# Patient Record
Sex: Male | Born: 1955 | Race: Black or African American | Hispanic: No | Marital: Single | Smoking: Current every day smoker
Health system: Southern US, Community
[De-identification: ages and names within clinical notes are randomized; demographics above are authoritative.]

## PROBLEM LIST (undated history)

## (undated) DIAGNOSIS — I319 Disease of pericardium, unspecified: Secondary | ICD-10-CM

## (undated) DIAGNOSIS — I10 Essential (primary) hypertension: Secondary | ICD-10-CM

---

## 2005-02-07 ENCOUNTER — Emergency Department (HOSPITAL_COMMUNITY): Admission: EM | Admit: 2005-02-07 | Discharge: 2005-02-07 | Payer: Self-pay | Admitting: Emergency Medicine

## 2007-02-13 ENCOUNTER — Emergency Department (HOSPITAL_COMMUNITY): Admission: EM | Admit: 2007-02-13 | Discharge: 2007-02-13 | Payer: Self-pay | Admitting: Family Medicine

## 2012-03-25 ENCOUNTER — Emergency Department (HOSPITAL_COMMUNITY)
Admission: EM | Admit: 2012-03-25 | Discharge: 2012-03-25 | Disposition: A | Discharge status: Home / Self Care | Payer: Self-pay | Attending: Emergency Medicine | Admitting: Emergency Medicine

## 2012-03-25 ENCOUNTER — Encounter (HOSPITAL_COMMUNITY): Payer: Self-pay | Admitting: Emergency Medicine

## 2012-03-25 DIAGNOSIS — F17200 Nicotine dependence, unspecified, uncomplicated: Secondary | ICD-10-CM | POA: Insufficient documentation

## 2012-03-25 DIAGNOSIS — K0889 Other specified disorders of teeth and supporting structures: Secondary | ICD-10-CM

## 2012-03-25 DIAGNOSIS — K089 Disorder of teeth and supporting structures, unspecified: Secondary | ICD-10-CM | POA: Insufficient documentation

## 2012-03-25 DIAGNOSIS — Z888 Allergy status to other drugs, medicaments and biological substances status: Secondary | ICD-10-CM | POA: Insufficient documentation

## 2012-03-25 MED ORDER — PENICILLIN V POTASSIUM 500 MG PO TABS: 500.0000 mg | ORAL_TABLET | Freq: Four times a day (QID) | ORAL | Status: AC

## 2012-03-25 MED ORDER — OXYCODONE-ACETAMINOPHEN 5-325 MG PO TABS: 1.0000 | ORAL_TABLET | ORAL | Status: DC | PRN

## 2012-03-25 NOTE — ED Provider Notes (Signed)
History     CSN: [id number]  Arrival date & time 03/25/12  7829   First MD Initiated Contact with Patient 03/25/12 [phone]      Chief Complaint  Patient presents with  . Dental Pain    Patient is a 56 y.o. male presenting with tooth pain. The history is provided by the patient.  Dental PainThe primary symptoms include mouth pain. Primary symptoms do not include fever. The symptoms began yesterday. The symptoms are worsening. The symptoms are new. The symptoms occur constantly.  Additional symptoms do not include: facial swelling.     PMH - none  History reviewed. No pertinent past surgical history.  History reviewed. No pertinent family history.  History  Substance Use Topics  . Smoking status: Current Every Day Smoker  . Smokeless tobacco: Not on file  . Alcohol Use: Yes      Review of Systems  Constitutional: Negative for fever.  HENT: Negative for facial swelling.     Allergies  Aspirin  Home Medications   Current Outpatient Rx  Name Route Sig Dispense Refill  . OXYCODONE-ACETAMINOPHEN 5-325 MG PO TABS Oral Take 1 tablet by mouth every 4 (four) hours as needed for pain. 3 tablet 0  . PENICILLIN V POTASSIUM 500 MG PO TABS Oral Take 1 tablet (500 mg total) by mouth 4 (four) times daily. 40 tablet 0    BP 160/93  Pulse 58  Temp 97.8 F (36.6 C) (Oral)  Resp 18  SpO2 100%  Physical Exam CONSTITUTIONAL: Well developed/well nourished HEAD AND FACE: Normocephalic/atraumatic EYES: EOMI/PERRL ENMT: Mucous membranes moist.  Poor dentition.  No trismus.  No focal abscess noted. NECK: supple no meningeal signs CV: S1/S2 noted, no murmurs/rubs/gallops noted LUNGS: Lungs are clear to auscultation bilaterally, no apparent distress ABDOMEN: soft, nontender, no rebound or guarding NEURO: Pt is awake/alert, moves all extremitiesx4 EXTREMITIES:full ROM SKIN: warm, color normal   ED Course  Procedures     1. Pain, dental       MDM  Nursing notes including  past medical history and social history reviewed and considered in documentation         Joya Gaskins, MD 03/25/12 [phone]

## 2012-03-25 NOTE — ED Notes (Signed)
Pt c/o right sided dental pain in jaw x 1 week

## 2012-12-12 ENCOUNTER — Encounter (HOSPITAL_COMMUNITY): Payer: Self-pay | Admitting: Emergency Medicine

## 2012-12-12 ENCOUNTER — Emergency Department (HOSPITAL_COMMUNITY): Payer: Self-pay

## 2012-12-12 ENCOUNTER — Emergency Department (HOSPITAL_COMMUNITY)
Admission: EM | Admit: 2012-12-12 | Discharge: 2012-12-12 | Disposition: A | Discharge status: Home / Self Care | Payer: Self-pay | Attending: Emergency Medicine | Admitting: Emergency Medicine

## 2012-12-12 DIAGNOSIS — X503XXA Overexertion from repetitive movements, initial encounter: Secondary | ICD-10-CM | POA: Insufficient documentation

## 2012-12-12 DIAGNOSIS — S29011A Strain of muscle and tendon of front wall of thorax, initial encounter: Secondary | ICD-10-CM

## 2012-12-12 DIAGNOSIS — F17200 Nicotine dependence, unspecified, uncomplicated: Secondary | ICD-10-CM | POA: Insufficient documentation

## 2012-12-12 DIAGNOSIS — Y999 Unspecified external cause status: Secondary | ICD-10-CM | POA: Insufficient documentation

## 2012-12-12 DIAGNOSIS — W292XXA Contact with other powered household machinery, initial encounter: Secondary | ICD-10-CM | POA: Insufficient documentation

## 2012-12-12 DIAGNOSIS — IMO0002 Reserved for concepts with insufficient information to code with codable children: Secondary | ICD-10-CM | POA: Insufficient documentation

## 2012-12-12 DIAGNOSIS — S0993XA Unspecified injury of face, initial encounter: Secondary | ICD-10-CM | POA: Insufficient documentation

## 2012-12-12 DIAGNOSIS — Y929 Unspecified place or not applicable: Secondary | ICD-10-CM | POA: Insufficient documentation

## 2012-12-12 LAB — POCT I-STAT TROPONIN I: Troponin i, poc: 0 ng/mL (ref 0.00–0.08)

## 2012-12-12 LAB — POCT I-STAT, CHEM 8
Calcium, Ion: 1.16 mmol/L (ref 1.12–1.23)
Chloride: 106 mEq/L (ref 96–112)
Glucose, Bld: 88 mg/dL (ref 70–99)
HCT: 51 % (ref 39.0–52.0)
Sodium: 139 mEq/L (ref 135–145)

## 2012-12-12 MED ORDER — HYDROCODONE-ACETAMINOPHEN 5-325 MG PO TABS
1.0000 | ORAL_TABLET | Freq: Once | ORAL | Status: AC
  Administered 2012-12-12: 1 via ORAL
  Filled 2012-12-12: qty 1

## 2012-12-12 MED ORDER — METHOCARBAMOL 500 MG PO TABS
500.0000 mg | ORAL_TABLET | Freq: Once | ORAL | Status: AC
  Administered 2012-12-12: 500 mg via ORAL
  Filled 2012-12-12: qty 1

## 2012-12-12 MED ORDER — METHOCARBAMOL 500 MG PO TABS: 500.0000 mg | ORAL_TABLET | Freq: Two times a day (BID) | ORAL | Status: DC

## 2012-12-12 MED ORDER — HYDROCODONE-ACETAMINOPHEN 5-325 MG PO TABS: 1.0000 | ORAL_TABLET | ORAL | Status: DC | PRN

## 2012-12-12 NOTE — ED Notes (Signed)
Pt states he cannot afford his medications or the hospital bill.  Case Mgmt notified and looking into assistance for Robaxin Rx.

## 2012-12-12 NOTE — ED Provider Notes (Signed)
History     CSN: [id number]  Arrival date & time 12/12/12  4098   First MD Initiated Contact with Patient 12/12/12 [phone]      Chief Complaint  Patient presents with  . Chest Pain    (Consider location/radiation/quality/duration/timing/severity/associated sxs/prior treatment) HPI Pt was lifting large household appliances yesterday and began having L shoulder, back, chest and arm pain yesterday evening that worsened through the night. Pain is worse with movement of arm and palpation of muscle of the area. No SOB. No cough. No PMH. No N/V. No lower ext swelling or pain. Pt states he took ibuprofen yesterday and began having acidic taste in mouth yesterday evening and this AM. No epigastric tenderness.  History reviewed. No pertinent past medical history.  History reviewed. No pertinent past surgical history.  History reviewed. No pertinent family history.  History  Substance Use Topics  . Smoking status: Current Every Day Smoker  . Smokeless tobacco: Not on file  . Alcohol Use: Yes      Review of Systems  Constitutional: Negative for fever and chills.  HENT: Positive for neck pain.   Respiratory: Negative for cough and shortness of breath.   Cardiovascular: Positive for chest pain. Negative for palpitations and leg swelling.  Gastrointestinal: Negative for nausea, vomiting and abdominal pain.  Musculoskeletal: Positive for myalgias. Negative for arthralgias.  Skin: Negative for rash and wound.  Neurological: Negative for dizziness, weakness, light-headedness, numbness and headaches.  All other systems reviewed and are negative.    Allergies  Aspirin  Home Medications   Current Outpatient Rx  Name  Route  Sig  Dispense  Refill  . HYDROcodone-acetaminophen (NORCO) 5-325 MG per tablet   Oral   Take 1 tablet by mouth every 4 (four) hours as needed for pain.   10 tablet   0   . methocarbamol (ROBAXIN) 500 MG tablet   Oral   Take 1 tablet (500 mg total) by mouth 2  (two) times daily.   20 tablet   0   . oxyCODONE-acetaminophen (PERCOCET/ROXICET) 5-325 MG per tablet   Oral   Take 1 tablet by mouth every 4 (four) hours as needed for pain.   3 tablet   0     BP 130/82  Pulse 76  Temp(Src) 98.7 F (37.1 C) (Oral)  Resp 16  SpO2 92%  Physical Exam  Nursing note and vitals reviewed. Constitutional: He is oriented to person, place, and time. He appears well-developed and well-nourished. No distress.  HENT:  Head: Normocephalic and atraumatic.  Mouth/Throat: Oropharynx is clear and moist.  Eyes: EOM are normal. Pupils are equal, round, and reactive to light.  Neck: Normal range of motion. Neck supple.  Cardiovascular: Normal rate and regular rhythm.   Pulmonary/Chest: Effort normal and breath sounds normal. No respiratory distress. He has no wheezes. He has no rales. He exhibits tenderness (Chest tenderness reproduced completely by palpation of left upper chest. No crepitance or deformtiy. ).  Abdominal: Soft. Bowel sounds are normal.  Musculoskeletal: Normal range of motion. He exhibits tenderness (TTP of L trapezieus, l supraspintous, L rhomboid, L deltoid, L bicep and L tricep. No evidence of injury). He exhibits no edema.  Pain worse with ROM of L shoulder. 2+radial pulses.   Neurological: He is alert and oriented to person, place, and time.  5/5 motor in all ext, sensation intact  Skin: Skin is warm and dry. No rash noted. No erythema.  Psychiatric: He has a normal mood and affect. His behavior  is normal.    ED Course  Procedures (including critical care time)  Labs Reviewed  POCT I-STAT, CHEM 8 - Abnormal; Notable for the following:    Hemoglobin 17.3 (*)    All other components within normal limits  POCT I-STAT TROPONIN I   Dg Chest 2 View  12/12/2012   *RADIOLOGY REPORT*  Clinical Data: Chest pain.  CHEST - 2 VIEW  Comparison: 02/07/2005.  Findings: Trachea is midline.  Heart size normal. Lungs are slightly low in volume.  There  is mild interstitial prominence diffusely.  No focal airspace consolidation and no pleural fluid. A lucent lesion in the distal right clavicle is unchanged from 2006 and is therefore considered benign.  IMPRESSION: Mild diffuse interstitial prominence may be due to vascular crowding related to slightly low lung volumes.  A viral process could also have this appearance.  Edema is considered less likely, given normal heart size.   Original Report Authenticated By: Leanna Battles, M.D.     1. Muscle strain of chest wall, initial encounter      Date: 12/12/2012  Rate: 77  Rhythm: normal sinus rhythm  QRS Axis: normal  Intervals: normal  ST/T Wave abnormalities: normal  Conduction Disutrbances:none  Narrative Interpretation:   Old EKG Reviewed: none available Questionable PR depression in inferior and lateral leads.   MDM  Pt symptoms are clearly musculoskeletal in nature. No old EKG to compare PR abnormalities to. Will do screening trop since symptoms > 12 hour old to rule out acute MI.   Neg trop. Will d/c home with symptomatic control.       Loren Racer, MD 12/12/12 1015

## 2012-12-12 NOTE — ED Notes (Signed)
Pt c/o CP with radiation into left arm starting today; pt sts worse with inspiration

## 2012-12-12 NOTE — ED Notes (Signed)
Dr. Yelverton at the bedside.  

## 2012-12-12 NOTE — Care Management Note (Signed)
    Page 1 of 1   12/12/2012     11:59:12 AM   CARE MANAGEMENT NOTE 12/12/2012  Patient:  Phillip Gilbert,Phillip Gilbert   Account Number:  [id number]  Date Initiated:  12/12/2012  Documentation initiated by:  Carel Schnee  Subjective/Objective Assessment:     Action/Plan:   Anticipated DC Date:  12/12/2012   Anticipated DC Plan:        DC Planning Services  Medication Assistance      Choice offered to / List presented to:             Status of service:   Medicare Important Message given?   (If response is "NO", the following Medicare IM given date fields will be blank) Date Medicare IM given:   Date Additional Medicare IM given:    Discharge Disposition:    Per UR Regulation:    If discussed at Long Length of Stay Meetings, dates discussed:    Comments:  Pt being discharged with two medications, one is a narcotic (not eligible for [facility]).  I spoke with the patient regarding out of pocket expense for both medications versus being assisted with the [facility] program and his the ability to only access this once per year.  The patient decided to use our outpatient pharmacy and pay for his prescriptions out of pocket.

## 2013-04-02 ENCOUNTER — Emergency Department (HOSPITAL_COMMUNITY)
Admission: EM | Admit: 2013-04-02 | Discharge: 2013-04-02 | Disposition: A | Discharge status: Home / Self Care | Payer: Self-pay | Attending: Emergency Medicine | Admitting: Emergency Medicine

## 2013-04-02 ENCOUNTER — Encounter (HOSPITAL_COMMUNITY): Payer: Self-pay | Admitting: Emergency Medicine

## 2013-04-02 DIAGNOSIS — F1010 Alcohol abuse, uncomplicated: Secondary | ICD-10-CM | POA: Insufficient documentation

## 2013-04-02 DIAGNOSIS — F1210 Cannabis abuse, uncomplicated: Secondary | ICD-10-CM | POA: Insufficient documentation

## 2013-04-02 DIAGNOSIS — K089 Disorder of teeth and supporting structures, unspecified: Secondary | ICD-10-CM | POA: Insufficient documentation

## 2013-04-02 DIAGNOSIS — Z79899 Other long term (current) drug therapy: Secondary | ICD-10-CM | POA: Insufficient documentation

## 2013-04-02 DIAGNOSIS — F17200 Nicotine dependence, unspecified, uncomplicated: Secondary | ICD-10-CM | POA: Insufficient documentation

## 2013-04-02 DIAGNOSIS — J329 Chronic sinusitis, unspecified: Secondary | ICD-10-CM | POA: Insufficient documentation

## 2013-04-02 DIAGNOSIS — Z8679 Personal history of other diseases of the circulatory system: Secondary | ICD-10-CM | POA: Insufficient documentation

## 2013-04-02 DIAGNOSIS — K0889 Other specified disorders of teeth and supporting structures: Secondary | ICD-10-CM

## 2013-04-02 HISTORY — DX: Disease of pericardium, unspecified: I31.9

## 2013-04-02 MED ORDER — FLUTICASONE PROPIONATE 50 MCG/ACT NA SUSP: 2.0000 | Freq: Every day | NASAL | Status: DC

## 2013-04-02 MED ORDER — HYDROCODONE-ACETAMINOPHEN 5-325 MG PO TABS: 1.0000 | ORAL_TABLET | Freq: Four times a day (QID) | ORAL | Status: DC | PRN

## 2013-04-02 MED ORDER — AMOXICILLIN 500 MG PO CAPS: 500.0000 mg | ORAL_CAPSULE | Freq: Three times a day (TID) | ORAL | Status: DC

## 2013-04-02 NOTE — Discharge Instructions (Signed)
Continue ibuprofen for pain. Saline nasal rinses. Amoxicillin as prescribed until all gone. flonase for congestion. Norco as prescribed as needed for severe pain. Follow up with your dentist and primary care doctor.    Dental Pain A tooth ache may be caused by cavities (tooth decay). Cavities expose the nerve of the tooth to air and hot or cold temperatures. It may come from an infection or abscess (also called a boil or furuncle) around your tooth. It is also often caused by dental caries (tooth decay). This causes the pain you are having. DIAGNOSIS  Your caregiver can diagnose this problem by exam. TREATMENT   If caused by an infection, it may be treated with medications which kill germs (antibiotics) and pain medications as prescribed by your caregiver. Take medications as directed.  Only take over-the-counter or prescription medicines for pain, discomfort, or fever as directed by your caregiver.  Whether the tooth ache today is caused by infection or dental disease, you should see your dentist as soon as possible for further care. SEEK MEDICAL CARE IF: The exam and treatment you received today has been provided on an emergency basis only. This is not a substitute for complete medical or dental care. If your problem worsens or new problems (symptoms) appear, and you are unable to meet with your dentist, call or return to this location. SEEK IMMEDIATE MEDICAL CARE IF:   You have a fever.  You develop redness and swelling of your face, jaw, or neck.  You are unable to open your mouth.  You have severe pain uncontrolled by pain medicine. MAKE SURE YOU:   Understand these instructions.  Will watch your condition.  Will get help right away if you are not doing well or get worse. Document Released: 06/19/2005 Document Revised: 09/11/2011 Document Reviewed: 02/05/2008 [facility] Patient Information 2014 [place], [place]. Sinusitis Sinusitis is redness, soreness, and swelling  (inflammation) of the paranasal sinuses. Paranasal sinuses are air pockets within the bones of your face (beneath the eyes, the middle of the forehead, or above the eyes). In healthy paranasal sinuses, mucus is able to drain out, and air is able to circulate through them by way of your nose. However, when your paranasal sinuses are inflamed, mucus and air can become trapped. This can allow bacteria and other germs to grow and cause infection. Sinusitis can develop quickly and last only a short time (acute) or continue over a long period (chronic). Sinusitis that lasts for more than 12 weeks is considered chronic.  CAUSES  Causes of sinusitis include:  Allergies.  Structural abnormalities, such as displacement of the cartilage that separates your nostrils (deviated septum), which can decrease the air flow through your nose and sinuses and affect sinus drainage.  Functional abnormalities, such as when the small hairs (cilia) that line your sinuses and help remove mucus do not work properly or are not present. SYMPTOMS  Symptoms of acute and chronic sinusitis are the same. The primary symptoms are pain and pressure around the affected sinuses. Other symptoms include:  Upper toothache.  Earache.  Headache.  Bad breath.  Decreased sense of smell and taste.  A cough, which worsens when you are lying flat.  Fatigue.  Fever.  Thick drainage from your nose, which often is green and may contain pus (purulent).  Swelling and warmth over the affected sinuses. DIAGNOSIS  Your caregiver will perform a physical exam. During the exam, your caregiver may:  Look in your nose for signs of abnormal growths in your nostrils (  nasal polyps).  Tap over the affected sinus to check for signs of infection.  View the inside of your sinuses (endoscopy) with a special imaging device with a light attached (endoscope), which is inserted into your sinuses. If your caregiver suspects that you have chronic  sinusitis, one or more of the following tests may be recommended:  Allergy tests.  Nasal culture A sample of mucus is taken from your nose and sent to a lab and screened for bacteria.  Nasal cytology A sample of mucus is taken from your nose and examined by your caregiver to determine if your sinusitis is related to an allergy. TREATMENT  Most cases of acute sinusitis are related to a viral infection and will resolve on their own within 10 days. Sometimes medicines are prescribed to help relieve symptoms (pain medicine, decongestants, nasal steroid sprays, or saline sprays).  However, for sinusitis related to a bacterial infection, your caregiver will prescribe antibiotic medicines. These are medicines that will help kill the bacteria causing the infection.  Rarely, sinusitis is caused by a fungal infection. In theses cases, your caregiver will prescribe antifungal medicine. For some cases of chronic sinusitis, surgery is needed. Generally, these are cases in which sinusitis recurs more than 3 times per year, despite other treatments. HOME CARE INSTRUCTIONS   Drink plenty of water. Water helps thin the mucus so your sinuses can drain more easily.  Use a humidifier.  Inhale steam 3 to 4 times a day (for example, sit in the bathroom with the shower running).  Apply a warm, moist washcloth to your face 3 to 4 times a day, or as directed by your caregiver.  Use saline nasal sprays to help moisten and clean your sinuses.  Take over-the-counter or prescription medicines for pain, discomfort, or fever only as directed by your caregiver. SEEK IMMEDIATE MEDICAL CARE IF:  You have increasing pain or severe headaches.  You have nausea, vomiting, or drowsiness.  You have swelling around your face.  You have vision problems.  You have a stiff neck.  You have difficulty breathing. MAKE SURE YOU:   Understand these instructions.  Will watch your condition.  Will get help right away if  you are not doing well or get worse. Document Released: 06/19/2005 Document Revised: 09/11/2011 Document Reviewed: 07/04/2011 [facility] Patient Information 2014 [place], [place].

## 2013-04-02 NOTE — ED Notes (Signed)
Mouth pain lower x 3 days needs teeth pulled

## 2013-04-02 NOTE — ED Provider Notes (Signed)
CSN: [id number]     Arrival date & time 04/02/13  1015 History   This chart was scribed for non-physician practitioner Jaynie Crumble, PA-C working with Suzi Roots, MD by Valera Castle, ED scribe. This patient was seen in room TR05C/TR05C and the patient's care was started at 12:10 PM.    Chief Complaint  Patient presents with  . Dental Pain    Patient is a 57 y.o. male presenting with tooth pain. The history is provided by the patient. No language interpreter was used.  Dental Pain Location:  Lower Severity:  Moderate Onset quality:  Gradual Duration:  3 days Timing:  Constant Progression:  Worsening Chronicity:  New Context comment:  Pt states he needs his teeth pulled. Associated symptoms: no fever    HPI Comments: Phillip Gilbert is a 57 y.o. male with a h/o pericarditis who presents to the Emergency Department complaining of gradual, moderate, constant, lower dental pain, onset 3 days ago. He reports that he needs to have his teeth pulled. He reports associated congestion and reports that he has been unable to sleep due to the pain. He denies fever, and any other associated symptoms. He reports being any every day smoker, marijuana, and EtOH use. He has an allergy to aspirin. He denies any other medical history.   Dentist - [facility].  PCP - Jeanice Lim.  Past Medical History  Diagnosis Date  . Pericarditis    History reviewed. No pertinent past surgical history. No family history on file. History  Substance Use Topics  . Smoking status: Current Every Day Smoker  . Smokeless tobacco: Not on file  . Alcohol Use: Yes    Review of Systems  Constitutional: Negative for fever.  HENT: Positive for dental problem (Lower dental pain.).   All other systems reviewed and are negative.    Allergies  Aspirin  Home Medications   Current Outpatient Rx  Name  Route  Sig  Dispense  Refill  . acetaminophen (TYLENOL) 325 MG tablet   Oral   Take 650 mg  by mouth every 6 (six) hours as needed for pain.         Marland Kitchen amitriptyline (ELAVIL) 25 MG tablet   Oral   Take 25 mg by mouth at bedtime.         . traMADol (ULTRAM) 50 MG tablet   Oral   Take 300 mg by mouth every 6 (six) hours as needed for pain.          Triage Vitals: BP 136/83  Pulse 83  Temp(Src) 98.3 F (36.8 C)  SpO2 97%  Physical Exam  Nursing note and vitals reviewed. Constitutional: He is oriented to person, place, and time. He appears well-developed and well-nourished. No distress.  HENT:  Head: Normocephalic and atraumatic.  Right Ear: Tympanic membrane, external ear and ear canal normal.  Left Ear: Tympanic membrane, external ear and ear canal normal.  Mouth/Throat: Uvula is midline, oropharynx is clear and moist and mucous membranes are normal.  Clear nasal drainage. Edentulous upper gum. Poor dentition with several cavities to lower teeth. Mild gum swelling over central and lower incisors. Tenderness to palpation.  Eyes: EOM are normal.  Neck: Neck supple. No tracheal deviation present.  Cardiovascular: Normal rate.   Pulmonary/Chest: Effort normal. No respiratory distress.  Musculoskeletal: Normal range of motion.  Neurological: He is alert and oriented to person, place, and time.  Skin: Skin is warm and dry.  Psychiatric: He has a normal mood  and affect. His behavior is normal.    ED Course  Procedures (including critical care time)  DIAGNOSTIC STUDIES: Oxygen Saturation is 97% on room air, normal by my interpretation.    COORDINATION OF CARE: 12:14 PM-Discussed treatment plan which includes Amoxil, Flonase, and Norco with pt at bedside and pt agreed to plan. Recommeded pt f/u with his dentist.     Labs Review Labs Reviewed - No data to display Imaging Review No results found.  MDM   1. Sinusitis   2. Pain, dental    Patient with poor dentition, but possible dental abscess to the lower central incisors. Patient also having some sinus  congestion and sinus pain and pressure. I will start her on amoxicillin for the infection, Flonase for sinusitis, pain medication. Followup with a dentist and primary care Dr.  Ceasar Mons Vitals:   04/02/13 1041 04/02/13 1225  BP: 136/83 145/74  Pulse: 83 84  Temp: 98.3 F (36.8 C) 98.1 F (36.7 C)  TempSrc:  Oral  Resp:  18  SpO2: 97% 98%    I personally performed the services described in this documentation, which was scribed in my presence. The recorded information has been reviewed and is accurate.    Lottie Mussel, PA-C 04/02/13 1616

## 2013-04-07 NOTE — ED Provider Notes (Signed)
Medical screening examination/treatment/procedure(s) were performed by non-physician practitioner and as supervising physician I was immediately available for consultation/collaboration.   Regine Christian E Jae Bruck, MD 04/07/13 0736 

## 2019-02-20 ENCOUNTER — Emergency Department (HOSPITAL_COMMUNITY): Payer: No Typology Code available for payment source

## 2019-02-20 ENCOUNTER — Encounter (HOSPITAL_COMMUNITY): Payer: Self-pay | Admitting: Emergency Medicine

## 2019-02-20 ENCOUNTER — Emergency Department (HOSPITAL_COMMUNITY)
Admission: EM | Admit: 2019-02-20 | Discharge: 2019-02-20 | Disposition: A | Discharge status: Home / Self Care | Payer: No Typology Code available for payment source | Attending: Emergency Medicine | Admitting: Emergency Medicine

## 2019-02-20 ENCOUNTER — Other Ambulatory Visit: Payer: Self-pay

## 2019-02-20 DIAGNOSIS — M545 Low back pain: Secondary | ICD-10-CM | POA: Insufficient documentation

## 2019-02-20 DIAGNOSIS — M546 Pain in thoracic spine: Secondary | ICD-10-CM | POA: Insufficient documentation

## 2019-02-20 DIAGNOSIS — M549 Dorsalgia, unspecified: Secondary | ICD-10-CM

## 2019-02-20 DIAGNOSIS — F17210 Nicotine dependence, cigarettes, uncomplicated: Secondary | ICD-10-CM | POA: Insufficient documentation

## 2019-02-20 LAB — URINALYSIS, ROUTINE W REFLEX MICROSCOPIC
Bilirubin Urine: NEGATIVE
Glucose, UA: NEGATIVE mg/dL
Hgb urine dipstick: NEGATIVE
Ketones, ur: NEGATIVE mg/dL
Leukocytes,Ua: NEGATIVE
Nitrite: NEGATIVE
Protein, ur: NEGATIVE mg/dL
Specific Gravity, Urine: 1.014 (ref 1.005–1.030)
pH: 6 (ref 5.0–8.0)

## 2019-02-20 MED ORDER — CYCLOBENZAPRINE HCL 10 MG PO TABS
5.0000 mg | ORAL_TABLET | Freq: Once | ORAL | Status: AC
  Administered 2019-02-20: 15:00:00 5 mg via ORAL
  Filled 2019-02-20: qty 1

## 2019-02-20 MED ORDER — CYCLOBENZAPRINE HCL 5 MG PO TABS: 5.0000 mg | ORAL_TABLET | Freq: Two times a day (BID) | ORAL | 0 refills | Status: DC | PRN

## 2019-02-20 NOTE — ED Notes (Signed)
Patient transported to X-ray 

## 2019-02-20 NOTE — ED Notes (Signed)
Patient Alert and oriented to baseline. Stable and ambulatory to baseline. Patient verbalized understanding of the discharge instructions.  Patient belongings were taken by the patient.   

## 2019-02-20 NOTE — Discharge Instructions (Addendum)
Please read attached information. If you experience any new or worsening signs or symptoms please return to the emergency room for evaluation. Please follow-up with your primary care provider or specialist as discussed. Please use medication prescribed only as directed and discontinue taking if you have any concerning signs or symptoms.   °

## 2019-02-20 NOTE — ED Provider Notes (Signed)
[facility] EMERGENCY DEPARTMENT Provider Note   CSN: [id number] Arrival date & time: 02/20/19  1145     History   Chief Complaint Chief Complaint  Patient presents with  . Back Pain    HPI Phillip Gilbert is a 63 y.o. male.     HPI   63 year old male presents today with complaints of back pain.  Patient notes over the last month he has had throbbing bilateral thoracic lumbar back pain.  He notes is worse with movement, denies any abdominal pain fever nausea vomiting.  Denies any distal neurological deficits.  Denies any chest pain or shortness of breath, no cough.  He notes he has been using Doan's back pills without improvement in symptoms.  Also notes he is using gabapentin and lidocaine patches.  Denies any lower extremity edema.  He notes normal urination.    Past Medical History:  Diagnosis Date  . Pericarditis     There are no active problems to display for this patient.   History reviewed. No pertinent surgical history.      Home Medications    Prior to Admission medications   Medication Sig Start Date End Date Taking? Authorizing Provider  acetaminophen (TYLENOL) 325 MG tablet Take 650 mg by mouth every 6 (six) hours as needed for pain.    [provider]  amitriptyline (ELAVIL) 25 MG tablet Take 25 mg by mouth at bedtime.    [provider]  amoxicillin (AMOXIL) 500 MG capsule Take 1 capsule (500 mg total) by mouth 3 (three) times daily. 04/02/13   Kirichenko, Tatyana, PA-C  cyclobenzaprine (FLEXERIL) 5 MG tablet Take 1 tablet (5 mg total) by mouth 2 (two) times daily as needed for muscle spasms. 02/20/19   Elford Evilsizer, Dellis Filbert, PA-C  fluticasone (FLONASE) 50 MCG/ACT nasal spray Place 2 sprays into the nose daily. 04/02/13   Kirichenko, Lahoma Rocker, PA-C  HYDROcodone-acetaminophen (NORCO) 5-325 MG per tablet Take 1 tablet by mouth every 6 (six) hours as needed for pain. 04/02/13   Kirichenko, Tatyana, PA-C  traMADol (ULTRAM) 50 MG  tablet Take 300 mg by mouth every 6 (six) hours as needed for pain.    [provider]    Family History No family history on file.  Social History Social History   Tobacco Use  . Smoking status: Current Every Day Smoker  . Smokeless tobacco: Never Used  Substance Use Topics  . Alcohol use: Yes  . Drug use: Yes    Types: Marijuana, Cocaine     Allergies   Aspirin   Review of Systems Review of Systems  All other systems reviewed and are negative.   Physical Exam Updated Vital Signs BP (!) 144/89 (BP Location: Left Arm)   Pulse 73   Temp 98.4 F (36.9 C) (Oral)   Resp 16   Ht 5\' 10"  (1.778 m)   Wt 79.8 kg   SpO2 97%   BMI 25.25 kg/m   Physical Exam Vitals signs and nursing note reviewed.  Constitutional:      Appearance: He is well-developed.  HENT:     Head: Normocephalic and atraumatic.  Eyes:     General: No scleral icterus.       Right eye: No discharge.        Left eye: No discharge.     Conjunctiva/sclera: Conjunctivae normal.     Pupils: Pupils are equal, round, and reactive to light.  Neck:     Musculoskeletal: Normal range of motion.  Vascular: No JVD.     Trachea: No tracheal deviation.  Pulmonary:     Effort: Pulmonary effort is normal.     Breath sounds: No stridor.  Musculoskeletal:     Comments: Tenderness palpation of bilateral lower thoracic and upper lumbar musculature, no CT or L-spine tenderness palpation bilateral upper and lower extremity sensation strength and motor function intact  Neurological:     Mental Status: He is alert and oriented to person, place, and time.     Coordination: Coordination normal.  Psychiatric:        Behavior: Behavior normal.        Thought Content: Thought content normal.        Judgment: Judgment normal.    ED Treatments / Results  Labs (all labs ordered are listed, but only abnormal results are displayed) Labs Reviewed  URINALYSIS, ROUTINE W REFLEX MICROSCOPIC    EKG None   Radiology Dg Thoracic Spine 2 View  Result Date: 02/20/2019 CLINICAL DATA:  Back pain EXAM: THORACIC SPINE 2 VIEWS COMPARISON:  Chest x-ray 12/12/2012 FINDINGS: There is no evidence of thoracic spine fracture. Alignment is normal. No other significant bone abnormalities are identified. IMPRESSION: Negative. Electronically Signed   By: Jasmine PangKim  Fujinaga M.D.   On: 02/20/2019 14:05   Dg Lumbar Spine Complete  Result Date: 02/20/2019 CLINICAL DATA:  Back pain EXAM: LUMBAR SPINE - COMPLETE 4+ VIEW COMPARISON:  None. FINDINGS: Lumbar alignment is normal. Vertebral body heights are maintained. Mild disc space narrowing at L3-L4. Posterior facet degenerative changes. IMPRESSION: No acute osseous abnormality.  Mild degenerative change at L3-L4. Electronically Signed   By: Jasmine PangKim  Fujinaga M.D.   On: 02/20/2019 14:08    Procedures Procedures (including critical care time)  Medications Ordered in ED Medications  cyclobenzaprine (FLEXERIL) tablet 5 mg (5 mg Oral Given 02/20/19 1522)     Initial Impression / Assessment and Plan / ED Course  I have reviewed the triage vital signs and the nursing notes.  Pertinent labs & imaging results that were available during my care of the patient were reviewed by me and considered in my medical decision making (see chart for details).        Labs:   Imaging:  Consults:  Therapeutics:  Discharge Meds:   Assessment/Plan: 63 year old male presents today with likely muscular back pain.  He has no signs of intra-abdominal pathology, reproduced on my exam.  He is well-appearing in no acute distress.  Discharged with symptomatic care and strict return precautions.  He verbalized understanding and agreement to today's plan had no further questions or concerns at time of discharge.   Final Clinical Impressions(s) / ED Diagnoses   Final diagnoses:  Musculoskeletal back pain    ED Discharge Orders         Ordered    cyclobenzaprine (FLEXERIL) 5 MG tablet  2  times daily PRN     02/20/19 1531           Eyvonne MechanicHedges, Edlyn Rosenburg, PA-C 02/20/19 1539    Blane OharaZavitz, Joshua, MD 02/20/19 [phone]

## 2019-02-20 NOTE — ED Triage Notes (Signed)
Pt states he has been having lower back pain for over a month. He is concerned it's his kidneys. Pt states he does feel pressure when he needs to urinate but doesn't always urinate a lot. Denies painful urination.

## 2019-02-20 NOTE — ED Notes (Signed)
.  bfd

## 2019-11-25 ENCOUNTER — Encounter (HOSPITAL_COMMUNITY): Payer: Self-pay | Admitting: *Deleted

## 2019-11-25 ENCOUNTER — Emergency Department (HOSPITAL_COMMUNITY)
Admission: EM | Admit: 2019-11-25 | Discharge: 2019-11-25 | Disposition: A | Discharge status: Home / Self Care | Payer: No Typology Code available for payment source | Attending: Emergency Medicine | Admitting: Emergency Medicine

## 2019-11-25 ENCOUNTER — Emergency Department (HOSPITAL_COMMUNITY): Payer: No Typology Code available for payment source

## 2019-11-25 DIAGNOSIS — J40 Bronchitis, not specified as acute or chronic: Secondary | ICD-10-CM

## 2019-11-25 DIAGNOSIS — Z79899 Other long term (current) drug therapy: Secondary | ICD-10-CM | POA: Diagnosis not present

## 2019-11-25 DIAGNOSIS — K219 Gastro-esophageal reflux disease without esophagitis: Secondary | ICD-10-CM | POA: Diagnosis not present

## 2019-11-25 DIAGNOSIS — Z720 Tobacco use: Secondary | ICD-10-CM

## 2019-11-25 DIAGNOSIS — F17210 Nicotine dependence, cigarettes, uncomplicated: Secondary | ICD-10-CM | POA: Insufficient documentation

## 2019-11-25 DIAGNOSIS — R0789 Other chest pain: Secondary | ICD-10-CM | POA: Diagnosis present

## 2019-11-25 LAB — CBC
HCT: 50 % (ref 39.0–52.0)
Hemoglobin: 16.7 g/dL (ref 13.0–17.0)
MCH: 29.4 pg (ref 26.0–34.0)
MCHC: 33.4 g/dL (ref 30.0–36.0)
MCV: 88 fL (ref 80.0–100.0)
Platelets: 238 10*3/uL (ref 150–400)
RBC: 5.68 MIL/uL (ref 4.22–5.81)
RDW: 14.7 % (ref 11.5–15.5)
WBC: 8.7 10*3/uL (ref 4.0–10.5)
nRBC: 0 % (ref 0.0–0.2)

## 2019-11-25 LAB — BASIC METABOLIC PANEL
Anion gap: 11 (ref 5–15)
BUN: 13 mg/dL (ref 8–23)
CO2: 23 mmol/L (ref 22–32)
Calcium: 9.1 mg/dL (ref 8.9–10.3)
Chloride: 106 mmol/L (ref 98–111)
Creatinine, Ser: 1.27 mg/dL — ABNORMAL HIGH (ref 0.61–1.24)
GFR calc Af Amer: >60 mL/min (ref >60)
GFR calc non Af Amer: 60 mL/min — ABNORMAL LOW (ref >60)
Glucose, Bld: 103 mg/dL — ABNORMAL HIGH (ref 70–99)
Potassium: 3.6 mmol/L (ref 3.5–5.1)
Sodium: 140 mmol/L (ref 135–145)

## 2019-11-25 LAB — TROPONIN I (HIGH SENSITIVITY)
Troponin I (High Sensitivity): 5 ng/L (ref <18)
Troponin I (High Sensitivity): 5 ng/L (ref <18)

## 2019-11-25 MED ORDER — DOXYCYCLINE HYCLATE 100 MG PO TABS
100.0000 mg | ORAL_TABLET | Freq: Once | ORAL | Status: AC
  Administered 2019-11-25: 100 mg via ORAL
  Filled 2019-11-25: qty 1

## 2019-11-25 MED ORDER — SODIUM CHLORIDE 0.9% FLUSH: 3.0000 mL | Freq: Once | INTRAVENOUS | Status: DC

## 2019-11-25 MED ORDER — SODIUM CHLORIDE 0.9 % IV SOLN
80.0000 mg | Freq: Once | INTRAVENOUS | Status: AC
  Administered 2019-11-25: 80 mg via INTRAVENOUS
  Filled 2019-11-25: qty 80

## 2019-11-25 MED ORDER — DOXYCYCLINE HYCLATE 100 MG PO CAPS
100.0000 mg | ORAL_CAPSULE | Freq: Two times a day (BID) | ORAL | 0 refills | Status: DC
Start: 2019-11-25 — End: 2019-11-25

## 2019-11-25 MED ORDER — PANTOPRAZOLE SODIUM 40 MG PO TBEC: 40.0000 mg | DELAYED_RELEASE_TABLET | Freq: Every day | ORAL | 0 refills | Status: AC

## 2019-11-25 MED ORDER — DOXYCYCLINE HYCLATE 100 MG PO CAPS
100.0000 mg | ORAL_CAPSULE | Freq: Two times a day (BID) | ORAL | 0 refills | Status: DC
Start: 2019-11-25 — End: 2022-12-31

## 2019-11-25 NOTE — ED Triage Notes (Signed)
To ED for eval of cp- started approx 3 hrs ago. States started like indigestion and radiated up chest and down right arm. Pt states he took tums without relief. Nausea. No vomiting. 10/10 pain currently. Described as 'someone sitting on chest'. States he's had this pain in past and prescribed Nitro - has not taken on tonight.

## 2019-11-25 NOTE — Discharge Instructions (Addendum)
Testing indicates that you have bronchitis, and likely some inflammation from gastroesophageal reflux causing your discomfort.  We are prescribing 2 medicines to improve your condition.  Make sure you follow-up with your primary care doctor, as needed for problems.  Try to stop smoking.

## 2019-11-25 NOTE — ED Provider Notes (Addendum)
[facility] EMERGENCY DEPARTMENT Provider Note   CSN: [id number] Arrival date & time: 11/25/19  0335     History Chief Complaint  Patient presents with  . Chest Pain    Phillip Gilbert is a 64 y.o. male.  HPI He presents for evaluation of his chest discomfort which reminds him of prior episode of pericarditis, which she had 25 years ago.  He describes intermittent chest pain, present since yesterday, on and off, only when he ambulates, extending from the epigastrium, to the center of the chest, then radiating to the right chest, and left arm.  The symptoms come and go.  They are not accompanied by diaphoresis, cough, shortness of breath, nausea, vomiting or diuresis.  He tried taking some Tums and Rolaids without relief.  He is a cigarette smoker.  No other recent illnesses.    Past Medical History:  Diagnosis Date  . Pericarditis     There are no problems to display for this patient.   History reviewed. No pertinent surgical history.     No family history on file.  Social History   Tobacco Use  . Smoking status: Current Every Day Smoker  . Smokeless tobacco: Never Used  Substance Use Topics  . Alcohol use: Yes  . Drug use: Yes    Types: Marijuana, Cocaine    Home Medications Prior to Admission medications   Medication Sig Start Date End Date Taking? Authorizing Provider  acetaminophen (TYLENOL) 325 MG tablet Take 650 mg by mouth every 6 (six) hours as needed for pain.   Yes [provider]  gabapentin (NEURONTIN) 300 MG capsule Take 300 mg by mouth 3 (three) times daily.   Yes [provider]  omeprazole (PRILOSEC) 40 MG capsule Take 40 mg by mouth daily.   Yes [provider]  cyclobenzaprine (FLEXERIL) 5 MG tablet Take 1 tablet (5 mg total) by mouth 2 (two) times daily as needed for muscle spasms. Patient not taking: Reported on 11/25/2019 02/20/19   Eyvonne Mechanic, PA-C  doxycycline (VIBRAMYCIN) 100 MG capsule Take 1  capsule (100 mg total) by mouth 2 (two) times daily. One po bid x 7 days 11/25/19   Mancel Bale, MD  fluticasone [facility]) 50 MCG/ACT nasal spray Place 2 sprays into the nose daily. Patient not taking: Reported on 11/25/2019 04/02/13   Jaynie Crumble, PA-C  HYDROcodone-acetaminophen (NORCO) 5-325 MG per tablet Take 1 tablet by mouth every 6 (six) hours as needed for pain. Patient not taking: Reported on 11/25/2019 04/02/13   Jaynie Crumble, PA-C  pantoprazole (PROTONIX) 40 MG tablet Take 1 tablet (40 mg total) by mouth daily. 11/25/19   Mancel Bale, MD    Allergies    Aspirin  Review of Systems   Review of Systems  All other systems reviewed and are negative.   Physical Exam Updated Vital Signs BP 125/73   Pulse 68   Temp 97.9 F (36.6 C)   Resp 20   Ht 5\' 10"  (1.778 m)   Wt 77.1 kg   SpO2 96%   BMI 24.39 kg/m   Physical Exam Vitals and nursing note reviewed.  Constitutional:      Appearance: He is well-developed.  HENT:     Head: Normocephalic and atraumatic.     Right Ear: External ear normal.     Left Ear: External ear normal.  Eyes:     Conjunctiva/sclera: Conjunctivae normal.     Pupils: Pupils are equal, round, and reactive to light.  Neck:  Trachea: Phonation normal.  Cardiovascular:     Rate and Rhythm: Normal rate and regular rhythm.     Heart sounds: Normal heart sounds. No murmur. No friction rub.  Pulmonary:     Effort: Pulmonary effort is normal.     Breath sounds: Normal breath sounds.  Abdominal:     General: There is no distension.     Palpations: Abdomen is soft.     Tenderness: There is no abdominal tenderness.  Musculoskeletal:        General: Normal range of motion.     Cervical back: Normal range of motion and neck supple.  Skin:    General: Skin is warm and dry.  Neurological:     Mental Status: He is alert and oriented to person, place, and time.     Cranial Nerves: No cranial nerve deficit.     Sensory: No sensory  deficit.     Motor: No abnormal muscle tone.     Coordination: Coordination normal.  Psychiatric:        Mood and Affect: Mood normal.        Behavior: Behavior normal.        Thought Content: Thought content normal.        Judgment: Judgment normal.     ED Results / Procedures / Treatments   Labs (all labs ordered are listed, but only abnormal results are displayed) Labs Reviewed  BASIC METABOLIC PANEL - Abnormal; Notable for the following components:      Result Value   Glucose, Bld 103 (*)    Creatinine, Ser 1.27 (*)    GFR calc non Af Amer 60 (*)    All other components within normal limits  CBC  TROPONIN I (HIGH SENSITIVITY)  TROPONIN I (HIGH SENSITIVITY)    EKG EKG Interpretation  Date/Time:  Tuesday Nov 25 2019 03:38:36 EDT Ventricular Rate:  77 PR Interval:  138 QRS Duration: 84 QT Interval:  396 QTC Calculation: 448 R Axis:   79 Text Interpretation: Normal sinus rhythm Normal ECG since last tracing no significant change Confirmed by Mancel Bale [phone]) on 11/25/2019 9:28:27 AM   Radiology DG Chest 2 View  Result Date: 11/25/2019 CLINICAL DATA:  Central chest pain. EXAM: CHEST - 2 VIEW COMPARISON:  12/12/2012 FINDINGS: The cardiomediastinal contours are normal. Bronchial/interstitial thickening is chronic and unchanged from prior exam. Pulmonary vasculature is normal. No consolidation, pleural effusion, or pneumothorax. No acute osseous abnormalities are seen. IMPRESSION: Chronic bronchial thickening likely smoking related. No acute findings. Electronically Signed   By: Narda Rutherford M.D.   On: 11/25/2019 04:00    Procedures Procedures (including critical care time)  Medications Ordered in ED Medications  sodium chloride flush (NS) 0.9 % injection 3 mL (3 mLs Intravenous Not Given 11/25/19 0838)  pantoprazole (PROTONIX) 80 mg in sodium chloride 0.9 % 100 mL IVPB (0 mg Intravenous Stopped 11/25/19 1102)  doxycycline (VIBRA-TABS) tablet 100 mg (100 mg Oral  Given 11/25/19 0954)    ED Course  I have reviewed the triage vital signs and the nursing notes.  Pertinent labs & imaging results that were available during my care of the patient were reviewed by me and considered in my medical decision making (see chart for details).  Clinical Course as of Nov 25 1430  Tue Nov 25, 2019  0926 Normal  Troponin I (High Sensitivity) [EW]  [phone] Normal   [EW]  [phone] Normal except creatinine elevated, GFR slightly low  Basic metabolic panel(!) [EW]  [phone]  No infiltrate or CHF, bronchitis appears to be present, interpreted by me  DG Chest 2 View [EW]    Clinical Course User Index [EW] Mancel Bale, MD   MDM Rules/Calculators/A&P                       Patient Vitals for the past 24 hrs:  BP Temp Temp src Pulse Resp SpO2 Height Weight  11/25/19 1415 125/73 -- -- 68 20 96 % -- --  11/25/19 1400 121/61 -- -- (!) 56 18 99 % -- --  11/25/19 1345 (!) 142/80 -- -- (!) 59 18 96 % -- --  11/25/19 1330 (!) 145/93 -- -- (!) 57 18 95 % -- --  11/25/19 1315 (!) 143/90 -- -- 61 17 94 % -- --  11/25/19 1300 (!) 142/93 -- -- (!) 54 17 91 % -- --  11/25/19 1230 125/76 -- -- 61 17 92 % -- --  11/25/19 1130 (!) 145/80 -- -- 65 (!) 21 92 % -- --  11/25/19 1100 (!) 141/83 -- -- 75 19 96 % -- --  11/25/19 1015 (!) 154/87 -- -- (!) 52 18 95 % -- --  11/25/19 0945 (!) 154/97 -- -- (!) 54 16 96 % -- --  11/25/19 0848 -- -- -- (!) 54 20 100 % -- --  11/25/19 0846 (!) 151/94 -- -- -- -- -- -- --  11/25/19 0637 138/84 97.9 F (36.6 C) -- 69 17 98 % -- --  11/25/19 0342 139/87 97.7 F (36.5 C) Oral 81 16 94 % 5\' 10"  (1.778 m) 77.1 kg    10:42 AM Reevaluation with update and discussion. After initial assessment and treatment, an updated evaluation reveals after eating he is comfortable.  Findings discussed and questions answered.   Medical Decision Making:  This patient is presenting for evaluation of epigastric and chest discomfort, which does require a  range of treatment options, and is a complaint that involves a moderate risk of morbidity and mortality. The differential diagnoses include pulmonary infection, ACS, pericarditis, muscular chest discomfort. I decided to review old records, and in summary middle-aged male, presenting with chest discomfort reminding him of pericarditis symptoms.  He is a smoker.  I did not require additional historical information from anyone.  Clinical Laboratory Tests Ordered, included CBC, Metabolic panel and Troponin. Review indicates normal delta troponin, normal CBC and metabolic panel, except creatinine slightly elevated. Radiologic Tests Ordered, included chest x-ray.  I independently Visualized: Radiographic images, which show normal cardiac silhouette, no pneumonia or heart failure  Cardiac Monitor Tracing which shows normal sinus rhythm    Critical Interventions-clinical evaluation, laboratory testing, repeat troponin, radiologic imaging, observation, medication treatment and reassessment  After These Interventions, the Patient was reevaluated and was found comfortable, tolerating diet, without additional complaints.  Patient with nonspecific symptoms, possibly acid related, with reflux.  Doubt ACS, pericarditis, metabolic instability or impending vascular collapse.  CRITICAL CARE-no Performed by: Mancel Bale  Nursing Notes Reviewed/ Care Coordinated Applicable Imaging Reviewed Interpretation of Laboratory Data incorporated into ED treatment  The patient appears reasonably screened and/or stabilized for discharge and I doubt any other medical condition or other [facility] requiring further screening, evaluation, or treatment in the ED at this time prior to discharge.  Plan: Home Medications-continue current; Home Treatments-rest, fluids, gradual advance diet; return here if the recommended treatment, does not improve the symptoms; Recommended follow up-PCP checkup 1 week and.     Final Clinical  Impression(s) / ED Diagnoses Final diagnoses:  Bronchitis  Gastroesophageal reflux disease, unspecified whether esophagitis present  Tobacco abuse    Rx / DC Orders ED Discharge Orders         Ordered    doxycycline (VIBRAMYCIN) 100 MG capsule  2 times daily,   Status:  Discontinued     11/25/19 0949    doxycycline (VIBRAMYCIN) 100 MG capsule  2 times daily     11/25/19 1430    pantoprazole (PROTONIX) 40 MG tablet  Daily     11/25/19 1430           Daleen Bo, MD 11/25/19 1046    Daleen Bo, MD 11/25/19 1432

## 2019-11-25 NOTE — ED Notes (Signed)
Pt states the pain he is having feels like the same pain when he had pericarditis.

## 2019-11-25 NOTE — ED Notes (Signed)
Gave pt sprite and sandwich for po challenge.

## 2020-06-09 ENCOUNTER — Emergency Department (HOSPITAL_COMMUNITY)
Admission: EM | Admit: 2020-06-09 | Discharge: 2020-06-09 | Disposition: A | Discharge status: Home / Self Care | Payer: No Typology Code available for payment source | Attending: Emergency Medicine | Admitting: Emergency Medicine

## 2020-06-09 ENCOUNTER — Other Ambulatory Visit: Payer: Self-pay

## 2020-06-09 ENCOUNTER — Encounter (HOSPITAL_COMMUNITY): Payer: Self-pay | Admitting: Emergency Medicine

## 2020-06-09 DIAGNOSIS — R300 Dysuria: Secondary | ICD-10-CM | POA: Insufficient documentation

## 2020-06-09 DIAGNOSIS — Z5321 Procedure and treatment not carried out due to patient leaving prior to being seen by health care provider: Secondary | ICD-10-CM | POA: Insufficient documentation

## 2020-06-09 HISTORY — DX: Essential (primary) hypertension: I10

## 2020-06-09 NOTE — ED Triage Notes (Signed)
Patient states when he tries to urinate he feels a significant amount of lower abdominal pressure. Saw PCP for same complaint and was prescribe Flomax a month ago, reports some improvement in symptoms but pressure continues. Was told by PCP he needs to see a urologist but has not yet. Patient alert, oriented, ambulatory, and in no apparent distress at this time.

## 2020-06-09 NOTE — ED Notes (Signed)
Pt called 3 x no reponse

## 2020-06-12 ENCOUNTER — Encounter (HOSPITAL_COMMUNITY): Payer: Self-pay | Admitting: *Deleted

## 2020-06-12 ENCOUNTER — Emergency Department (HOSPITAL_COMMUNITY): Payer: No Typology Code available for payment source

## 2020-06-12 ENCOUNTER — Other Ambulatory Visit: Payer: Self-pay

## 2020-06-12 ENCOUNTER — Emergency Department (HOSPITAL_COMMUNITY)
Admission: EM | Admit: 2020-06-12 | Discharge: 2020-06-13 | Disposition: A | Discharge status: Home / Self Care | Payer: No Typology Code available for payment source | Attending: Emergency Medicine | Admitting: Emergency Medicine

## 2020-06-12 DIAGNOSIS — N3091 Cystitis, unspecified with hematuria: Secondary | ICD-10-CM | POA: Insufficient documentation

## 2020-06-12 DIAGNOSIS — F17200 Nicotine dependence, unspecified, uncomplicated: Secondary | ICD-10-CM | POA: Insufficient documentation

## 2020-06-12 DIAGNOSIS — R319 Hematuria, unspecified: Secondary | ICD-10-CM | POA: Diagnosis present

## 2020-06-12 DIAGNOSIS — I10 Essential (primary) hypertension: Secondary | ICD-10-CM | POA: Diagnosis not present

## 2020-06-12 LAB — CBC WITH DIFFERENTIAL/PLATELET
Abs Immature Granulocytes: 0.04 10*3/uL (ref 0.00–0.07)
Basophils Absolute: 0.1 10*3/uL (ref 0.0–0.1)
Basophils Relative: 1 %
Eosinophils Absolute: 0.1 10*3/uL (ref 0.0–0.5)
Eosinophils Relative: 1 %
HCT: 46.5 % (ref 39.0–52.0)
Hemoglobin: 15.3 g/dL (ref 13.0–17.0)
Immature Granulocytes: 0 %
Lymphocytes Relative: 24 %
Lymphs Abs: 2.3 10*3/uL (ref 0.7–4.0)
MCH: 28.8 pg (ref 26.0–34.0)
MCHC: 32.9 g/dL (ref 30.0–36.0)
MCV: 87.6 fL (ref 80.0–100.0)
Monocytes Absolute: 0.9 10*3/uL (ref 0.1–1.0)
Monocytes Relative: 10 %
Neutro Abs: 6.1 10*3/uL (ref 1.7–7.7)
Neutrophils Relative %: 64 %
Platelets: 288 10*3/uL (ref 150–400)
RBC: 5.31 MIL/uL (ref 4.22–5.81)
RDW: 14.2 % (ref 11.5–15.5)
WBC: 9.5 10*3/uL (ref 4.0–10.5)
nRBC: 0 % (ref 0.0–0.2)

## 2020-06-12 LAB — URINALYSIS, ROUTINE W REFLEX MICROSCOPIC
Bilirubin Urine: NEGATIVE
Glucose, UA: NEGATIVE mg/dL
Ketones, ur: NEGATIVE mg/dL
Nitrite: POSITIVE — AB
Protein, ur: 100 mg/dL — AB
RBC / HPF: >50 RBC/hpf — ABNORMAL HIGH (ref 0–5)
Specific Gravity, Urine: 1.012 (ref 1.005–1.030)
WBC, UA: >50 WBC/hpf — ABNORMAL HIGH (ref 0–5)
pH: 5 (ref 5.0–8.0)

## 2020-06-12 MED ORDER — SODIUM CHLORIDE 0.9 % IV SOLN
1.0000 g | Freq: Once | INTRAVENOUS | Status: AC
  Administered 2020-06-12: 1 g via INTRAVENOUS
  Filled 2020-06-12: qty 10

## 2020-06-12 NOTE — ED Triage Notes (Signed)
Pt states he has been having painful urination with blood in urine for a while.

## 2020-06-12 NOTE — ED Provider Notes (Signed)
[facility]-EMERGENCY DEPT Provider Note   CSN: [id number] Arrival date & time: 06/12/20  1831     History Chief Complaint  Patient presents with  . Hematuria    Phillip Gilbert is a 64 y.o. male.  Patient presents to the emergency department with a chief complaint of hematuria with associated dysuria.  He states that he has had the hematuria for about a week, but it just became painful over the past 3 days or so.  He reports subjective fevers and chills at home, but denies any measured temperature.  Denies any treatments prior to arrival.  He was seen at the [place], and told that he needed to follow-up with the urologist, but has an appointment in the distant future.  He denies any discharge.  He does report some left-sided flank pain.  Denies history of kidney stones.  The history is provided by the patient. No language interpreter was used.       Past Medical History:  Diagnosis Date  . Hypertension   . Pericarditis     There are no problems to display for this patient.   History reviewed. No pertinent surgical history.     No family history on file.  Social History   Tobacco Use  . Smoking status: Current Every Day Smoker  . Smokeless tobacco: Never Used  Substance Use Topics  . Alcohol use: Yes  . Drug use: Yes    Types: Marijuana, Cocaine    Home Medications Prior to Admission medications   Medication Sig Start Date End Date Taking? Authorizing Provider  acetaminophen (TYLENOL) 325 MG tablet Take 650 mg by mouth every 6 (six) hours as needed for pain.    [provider]  cyclobenzaprine (FLEXERIL) 5 MG tablet Take 1 tablet (5 mg total) by mouth 2 (two) times daily as needed for muscle spasms. Patient not taking: Reported on 11/25/2019 02/20/19   Eyvonne Mechanic, PA-C  doxycycline (VIBRAMYCIN) 100 MG capsule Take 1 capsule (100 mg total) by mouth 2 (two) times daily. One po bid x 7 days 11/25/19   Mancel Bale, MD  fluticasone  [facility]) 50 MCG/ACT nasal spray Place 2 sprays into the nose daily. Patient not taking: Reported on 11/25/2019 04/02/13   Jaynie Crumble, PA-C  gabapentin (NEURONTIN) 300 MG capsule Take 300 mg by mouth 3 (three) times daily.    [provider]  HYDROcodone-acetaminophen (NORCO) 5-325 MG per tablet Take 1 tablet by mouth every 6 (six) hours as needed for pain. Patient not taking: Reported on 11/25/2019 04/02/13   Jaynie Crumble, PA-C  omeprazole (PRILOSEC) 40 MG capsule Take 40 mg by mouth daily.    [provider]  pantoprazole (PROTONIX) 40 MG tablet Take 1 tablet (40 mg total) by mouth daily. 11/25/19   Mancel Bale, MD    Allergies    Aspirin  Review of Systems   Review of Systems  All other systems reviewed and are negative.   Physical Exam Updated Vital Signs BP (!) 143/98   Pulse 68   Temp (!) 97.5 F (36.4 C) (Oral)   Resp 16   Ht 5' 10.5" (1.791 m)   Wt 78.5 kg   SpO2 96%   BMI 24.47 kg/m   Physical Exam Vitals and nursing note reviewed.  Constitutional:      Appearance: He is well-developed and well-nourished.  HENT:     Head: Normocephalic and atraumatic.  Eyes:     Conjunctiva/sclera: Conjunctivae normal.  Cardiovascular:  Rate and Rhythm: Normal rate and regular rhythm.     Heart sounds: No murmur heard.   Pulmonary:     Effort: Pulmonary effort is normal. No respiratory distress.     Breath sounds: Normal breath sounds.  Abdominal:     Palpations: Abdomen is soft.     Tenderness: There is no abdominal tenderness.  Musculoskeletal:        General: No edema. Normal range of motion.     Cervical back: Neck supple.  Skin:    General: Skin is warm and dry.  Neurological:     Mental Status: He is alert and oriented to person, place, and time.  Psychiatric:        Mood and Affect: Mood and affect and mood normal.        Behavior: Behavior normal.     ED Results / Procedures / Treatments   Labs (all labs ordered are  listed, but only abnormal results are displayed) Labs Reviewed  URINALYSIS, ROUTINE W REFLEX MICROSCOPIC - Abnormal; Notable for the following components:      Result Value   APPearance CLOUDY (*)    Hgb urine dipstick LARGE (*)    Protein, ur 100 (*)    Nitrite POSITIVE (*)    Leukocytes,Ua LARGE (*)    RBC / HPF >50 (*)    WBC, UA >50 (*)    Bacteria, UA MANY (*)    All other components within normal limits  URINE CULTURE  CBC WITH DIFFERENTIAL/PLATELET  BASIC METABOLIC PANEL    EKG None  Radiology No results found.  Procedures Procedures (including critical care time)  Medications Ordered in ED Medications  cefTRIAXone (ROCEPHIN) 1 g in sodium chloride 0.9 % 100 mL IVPB (has no administration in time range)    ED Course  I have reviewed the triage vital signs and the nursing notes.  Pertinent labs & imaging results that were available during my care of the patient were reviewed by me and considered in my medical decision making (see chart for details).    MDM Rules/Calculators/A&P                          This patient complains of hematuria and dysuria, this involves an extensive number of treatment options, and is a complaint that carries with it a high risk of complications and morbidity.    Differential Dx Cystitis, KS, bladder cancer, pyelonephritis  Pertinent Labs I ordered, reviewed, and interpreted labs, which included CBC, BMP, UA, which shows no evidence of kidney injury.  UA consistent with infection.  Imaging Interpretation I ordered imaging studies which included CT renal, which showed no obstructive uropathy.  Renal cysts are noted and discussed with patient.   Medications I ordered medication rocephin for UTI.  Reassessments After the interventions stated above, I reevaluated the patient and found stable fir discharge.  Consultants none  Plan Discharge.    Final Clinical Impression(s) / ED Diagnoses Final diagnoses:  Hemorrhagic  cystitis    Rx / DC Orders ED Discharge Orders         Ordered    cephALEXin (KEFLEX) 500 MG capsule  2 times daily        06/13/20 0022           Roxy Horseman, PA-C 06/13/20 0025    Wynetta Fines, MD 06/15/20 [phone]

## 2020-06-13 LAB — BASIC METABOLIC PANEL
Anion gap: 11 (ref 5–15)
BUN: 15 mg/dL (ref 8–23)
CO2: 24 mmol/L (ref 22–32)
Calcium: 8.7 mg/dL — ABNORMAL LOW (ref 8.9–10.3)
Chloride: 104 mmol/L (ref 98–111)
Creatinine, Ser: 1.17 mg/dL (ref 0.61–1.24)
GFR, Estimated: >60 mL/min (ref >60)
Glucose, Bld: 99 mg/dL (ref 70–99)
Potassium: 3.7 mmol/L (ref 3.5–5.1)
Sodium: 139 mmol/L (ref 135–145)

## 2020-06-13 MED ORDER — CEPHALEXIN 500 MG PO CAPS
500.0000 mg | ORAL_CAPSULE | Freq: Two times a day (BID) | ORAL | 0 refills | Status: DC
Start: 2020-06-13 — End: 2022-12-31

## 2020-06-13 NOTE — Discharge Instructions (Addendum)
The blood in your urine is thought to be caused by infection.  If you do not improve with antibiotics, you will need to see the urologist listed.  Please make an appointment if you are not improving.  Your CT scan also showed cysts on your kidney.  Please discuss this with your regular doctor.

## 2020-06-15 LAB — URINE CULTURE: Culture: >=100000 — AB

## 2020-06-16 ENCOUNTER — Telehealth: Payer: Self-pay | Admitting: Emergency Medicine

## 2020-06-16 NOTE — Telephone Encounter (Signed)
Post ED Visit - Positive Culture Follow-up  Culture report reviewed by antimicrobial stewardship pharmacist: [facility] []  , Pharm.D. []  Enzo Bi, Pharm.D., BCPS AQ-ID []  , Pharm.D., BCPS []  Celedonio Miyamoto, .D., BCPS []  [place], .D., BCPS, AAHIVP []  Georgina Pillion, Pharm.D., BCPS, AAHIVP []  [address], PharmD, BCPS []  , PharmD, BCPS []  [place], PharmD, BCPS []  [place], PharmD []  , PharmD, BCPS []  Estella Husk, PharmD  Pharmacy Team []  Lysle Pearl, PharmD [x]  , PharmD []  Phillips Climes, PharmD []  , Rph []  Agapito Games) , PharmD []  Verlan Friends, PharmD []  , PharmD []  Mervyn Gay, PharmD []  , PharmD []  Vinnie Level, PharmD []  [facility], PharmD []  , PharmD []  Len Childs, PharmD   Positive urine culture Treated with cephalexin, organism sensitive to the same and no further patient follow-up is required at this time.  06/16/2020, 11:50 AM

## 2022-05-30 IMAGING — CT CT RENAL STONE PROTOCOL
2 of 4 series · 15 of 46 positions shown, 17 images · non-contrast
Comparison: Remote abdominal CT 02/07/2005

CLINICAL DATA: 64-year-old with hematuria.  Painful urination.

EXAM:
CT ABDOMEN AND PELVIS WITHOUT CONTRAST
TECHNIQUE: Multidetector CT imaging of the abdomen and pelvis was performed
following the standard protocol without IV contrast.

[Series 2: axial st · axial · 0.69mm/px · z∈[-486,-76]mm · 12 of 92 slices shown, 14 images]
[im 5/92  soft-tissue]
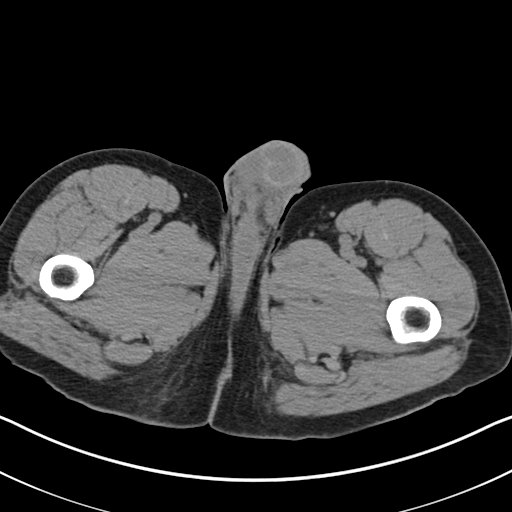
[im 5/92  bone]
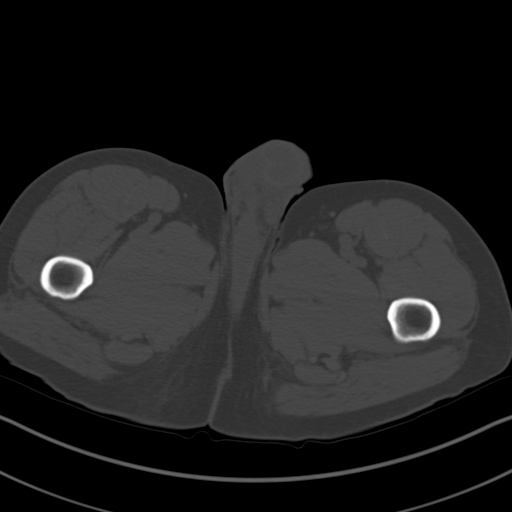
[im 15/92  soft-tissue]
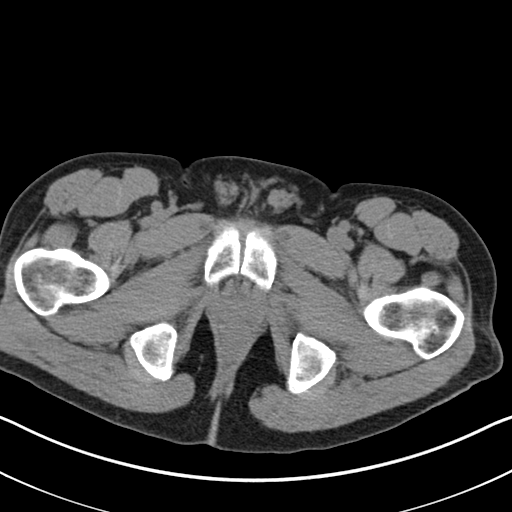
[im 20/92  soft-tissue]
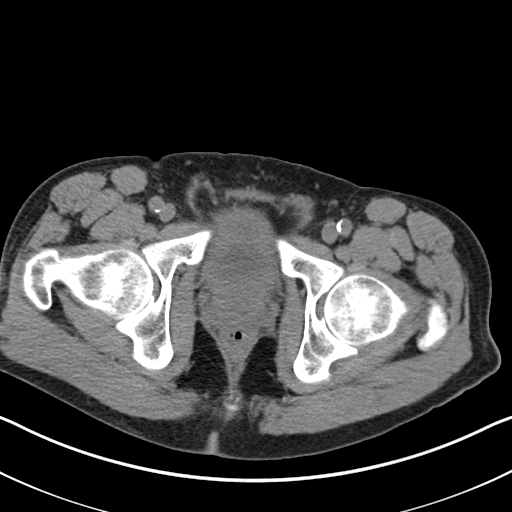
[im 29/92  soft-tissue]
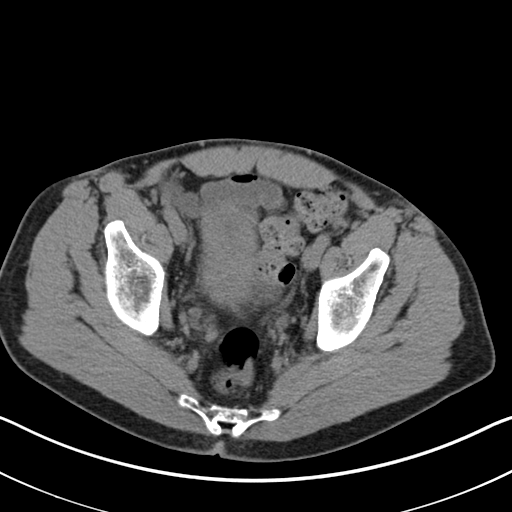
[im 34/92  soft-tissue]
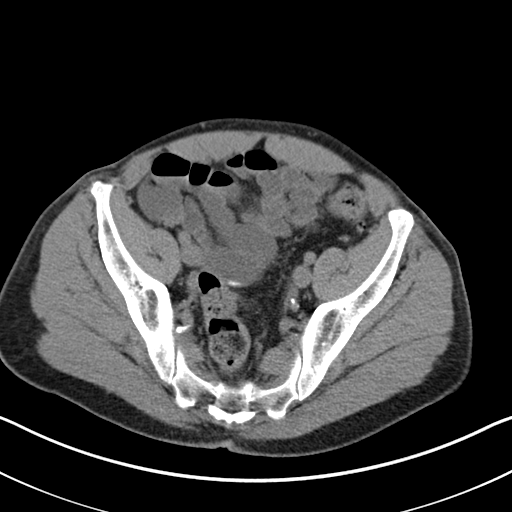
[im 44/92  soft-tissue]
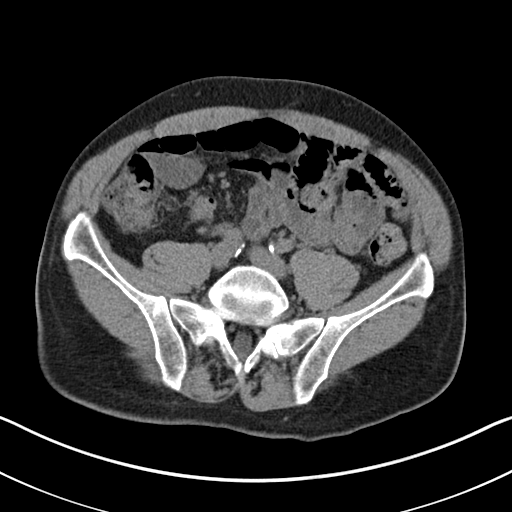
[im 48/92  soft-tissue]
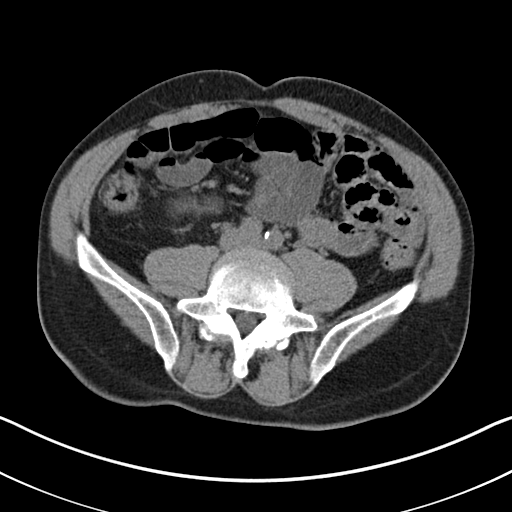
[im 58/92  soft-tissue]
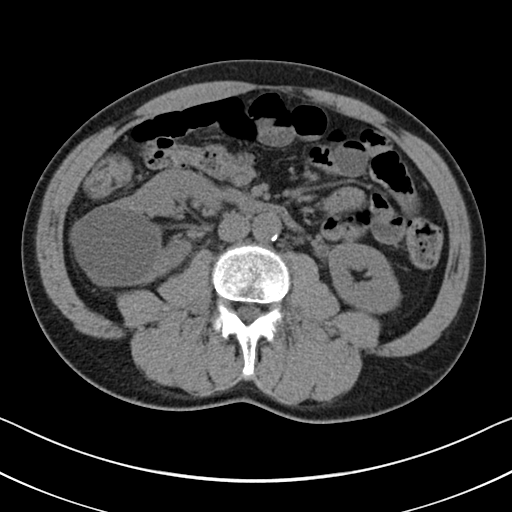
[im 63/92  soft-tissue]
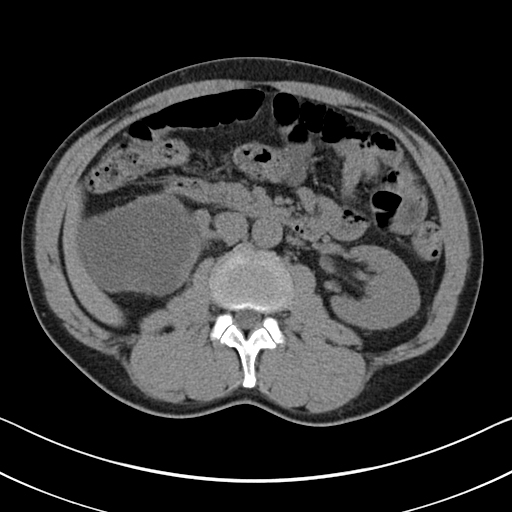
[im 63/92  bone]
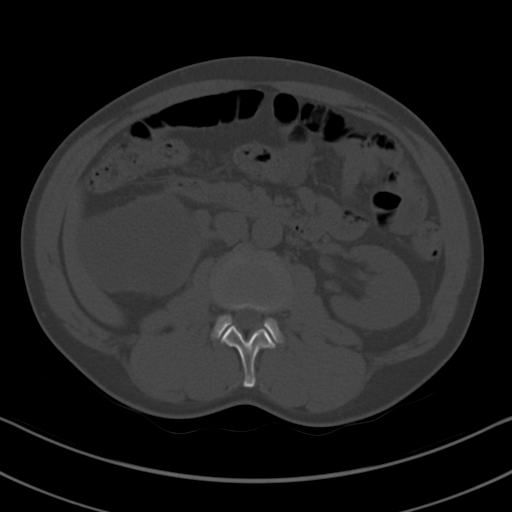
[im 72/92  soft-tissue]
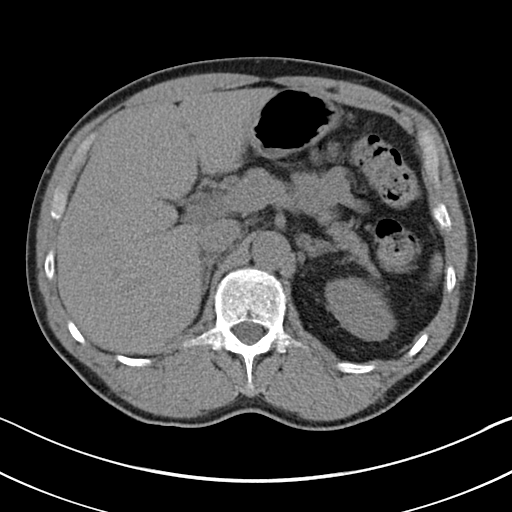
[im 77/92  soft-tissue]
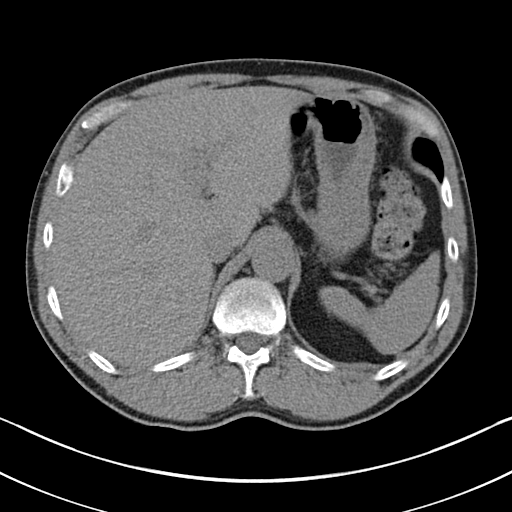
[im 87/92  soft-tissue]
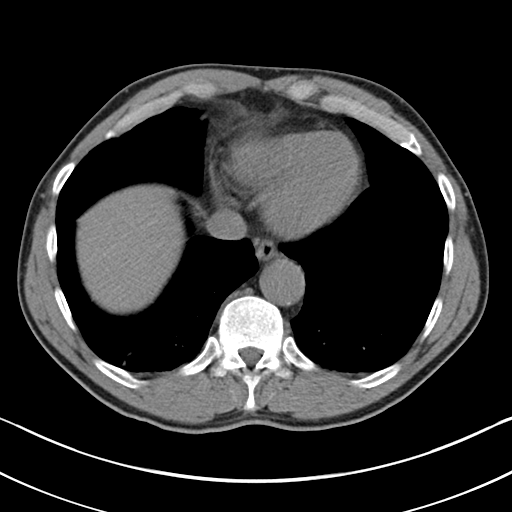

[Series 5: coronal · coronal · 0.70mm/px · 3 of 148 slices shown]
[im 50/148  soft-tissue]
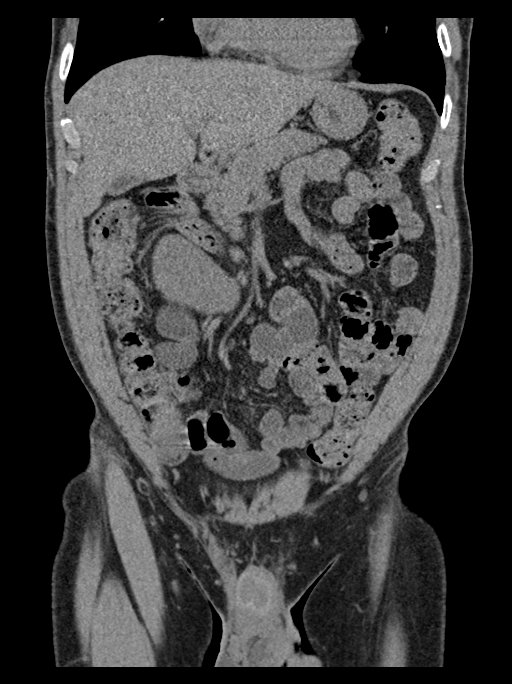
[im 66/148  soft-tissue]
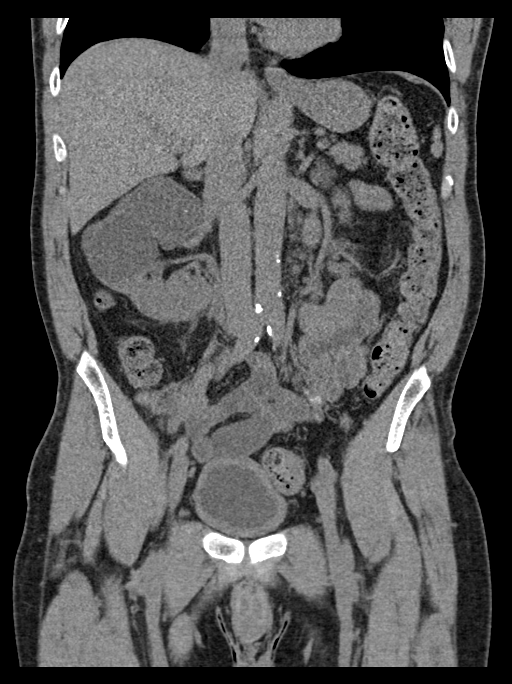
[im 82/148  soft-tissue]
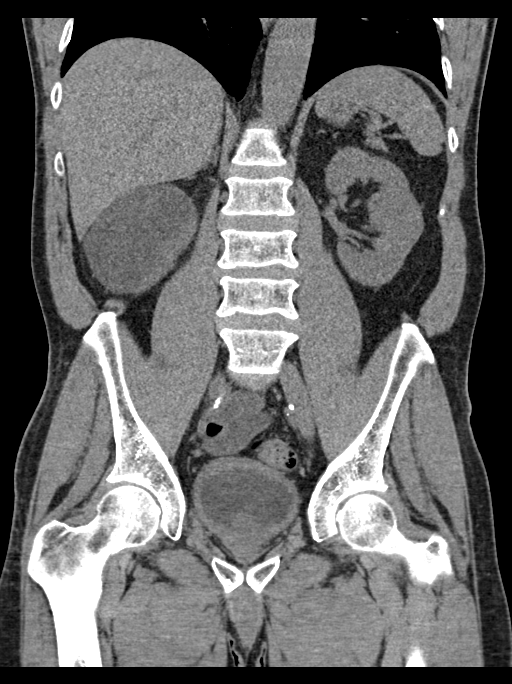

[15 of 46 positions shown; findings below may reference images not displayed]

FINDINGS: Lower chest: The heart is normal in size. Linear atelectasis or
scarring in both lower lobes. No consolidation or pleural fluid.

Hepatobiliary: No focal hepatic abnormality on noncontrast exam.
Partially distended gallbladder. No calcified gallstone. No biliary
dilatation or pericholecystic inflammation.

Pancreas: No ductal dilatation or inflammation.

Spleen: Normal in size without focal abnormality.

Adrenals/Urinary Tract: Slight left adrenal thickening without
dominant nodule. Normal right adrenal gland. Cystic changes in the
mid and upper right kidney measuring 7.8 x 6.3 x 6.7 cm. It is
unclear if this represents a single septated lesion versus adjacent
cysts. There is a peripheral calcification. Cyst has slightly
increased in size from 8666 CT. No hydronephrosis. No renal calculi.
No ureteral stones. Urinary bladder is partially distended, but
diffusely thick walled. Minimal perivesicular fat stranding.

Stomach/Bowel: Stomach is within normal limits. Appendix appears
normal. No evidence of bowel wall thickening, distention, or
inflammatory changes. Moderate colonic stool burden.

Vascular/Lymphatic: Normal caliber abdominal aorta with mild
atherosclerosis. Mild bi-iliac atherosclerosis. No bulky
abdominopelvic adenopathy.

Reproductive: Prostate is unremarkable.

Other: No ascites.  No free air.  No abdominal wall hernia.

Musculoskeletal: There are no acute or suspicious osseous
abnormalities. Scattered tiny bone islands throughout the pelvis.
Degenerative change in the lumbar spine with primarily facet
hypertrophy. Mild degenerative sclerosis about the left sacroiliac
joint.
IMPRESSION: 1. Diffusely thick-walled urinary bladder with minimal perivesicular
fat stranding, suggesting cystitis.
2. No renal stones or obstructive uropathy.
3. Cystic changes in the mid and upper right kidney measuring up to
7.8 cm. It is unclear if this represents a single septated lesion
versus adjacent cysts. Cyst has slightly increased in size from 8666
CT. Remote 8666 CT performed with contrast demonstrated enhancing
septa. No intervening exams available for comparison. Given
hematuria, consider nonemergent renal protocol MRI for
characterization.

Aortic Atherosclerosis ([id number]).

## 2022-12-31 ENCOUNTER — Other Ambulatory Visit: Payer: Self-pay

## 2022-12-31 ENCOUNTER — Emergency Department (HOSPITAL_COMMUNITY)
Admission: EM | Admit: 2022-12-31 | Discharge: 2022-12-31 | Disposition: A | Discharge status: Home / Self Care | Payer: No Typology Code available for payment source | Attending: Emergency Medicine | Admitting: Emergency Medicine

## 2022-12-31 ENCOUNTER — Encounter (HOSPITAL_COMMUNITY): Payer: Self-pay

## 2022-12-31 ENCOUNTER — Emergency Department (HOSPITAL_COMMUNITY): Payer: No Typology Code available for payment source

## 2022-12-31 DIAGNOSIS — W0110XA Fall on same level from slipping, tripping and stumbling with subsequent striking against unspecified object, initial encounter: Secondary | ICD-10-CM | POA: Insufficient documentation

## 2022-12-31 DIAGNOSIS — S72114A Nondisplaced fracture of greater trochanter of right femur, initial encounter for closed fracture: Secondary | ICD-10-CM | POA: Insufficient documentation

## 2022-12-31 DIAGNOSIS — W19XXXA Unspecified fall, initial encounter: Secondary | ICD-10-CM

## 2022-12-31 DIAGNOSIS — M25551 Pain in right hip: Secondary | ICD-10-CM | POA: Diagnosis present

## 2022-12-31 LAB — CBC WITH DIFFERENTIAL/PLATELET
Abs Immature Granulocytes: 0.03 10*3/uL (ref 0.00–0.07)
Basophils Absolute: 0.1 10*3/uL (ref 0.0–0.1)
Basophils Relative: 1 %
Eosinophils Absolute: 0.1 10*3/uL (ref 0.0–0.5)
Eosinophils Relative: 1 %
HCT: 51 % (ref 39.0–52.0)
Hemoglobin: 17 g/dL (ref 13.0–17.0)
Immature Granulocytes: 0 %
Lymphocytes Relative: 18 %
Lymphs Abs: 1.7 10*3/uL (ref 0.7–4.0)
MCH: 29.5 pg (ref 26.0–34.0)
MCHC: 33.3 g/dL (ref 30.0–36.0)
MCV: 88.5 fL (ref 80.0–100.0)
Monocytes Absolute: 0.7 10*3/uL (ref 0.1–1.0)
Monocytes Relative: 7 %
Neutro Abs: 7.1 10*3/uL (ref 1.7–7.7)
Neutrophils Relative %: 73 %
Platelets: 217 10*3/uL (ref 150–400)
RBC: 5.76 MIL/uL (ref 4.22–5.81)
RDW: 14.6 % (ref 11.5–15.5)
WBC: 9.6 10*3/uL (ref 4.0–10.5)
nRBC: 0 % (ref 0.0–0.2)

## 2022-12-31 LAB — BASIC METABOLIC PANEL
Anion gap: 9 (ref 5–15)
BUN: 12 mg/dL (ref 8–23)
CO2: 21 mmol/L — ABNORMAL LOW (ref 22–32)
Calcium: 8.8 mg/dL — ABNORMAL LOW (ref 8.9–10.3)
Chloride: 105 mmol/L (ref 98–111)
Creatinine, Ser: 1.14 mg/dL (ref 0.61–1.24)
GFR, Estimated: >60 mL/min (ref >60)
Glucose, Bld: 100 mg/dL — ABNORMAL HIGH (ref 70–99)
Potassium: 3.8 mmol/L (ref 3.5–5.1)
Sodium: 135 mmol/L (ref 135–145)

## 2022-12-31 LAB — PROTIME-INR
INR: 1 (ref 0.8–1.2)
Prothrombin Time: 12.9 seconds (ref 11.4–15.2)

## 2022-12-31 MED ORDER — FENTANYL CITRATE PF 50 MCG/ML IJ SOSY: 50.0000 ug | PREFILLED_SYRINGE | Freq: Once | INTRAMUSCULAR | Status: DC

## 2022-12-31 MED ORDER — HYDROCODONE-ACETAMINOPHEN 5-325 MG PO TABS
1.0000 | ORAL_TABLET | Freq: Once | ORAL | Status: AC
  Administered 2022-12-31: 1 via ORAL
  Filled 2022-12-31: qty 1

## 2022-12-31 MED ORDER — HYDROMORPHONE HCL 1 MG/ML IJ SOLN
1.0000 mg | Freq: Once | INTRAMUSCULAR | Status: AC
  Administered 2022-12-31: 1 mg via INTRAVENOUS
  Filled 2022-12-31: qty 1

## 2022-12-31 MED ORDER — OXYCODONE-ACETAMINOPHEN 5-325 MG PO TABS: 1.0000 | ORAL_TABLET | Freq: Four times a day (QID) | ORAL | 0 refills | Status: DC | PRN

## 2022-12-31 NOTE — Discharge Instructions (Signed)
Recommend Norco for pain control, follow-up outpatient with Orthopedics. You can bear weight as tolerated on your hip.

## 2022-12-31 NOTE — ED Notes (Signed)
Pt received for care at 1905.  Bed in lowest position, wheels locked.

## 2022-12-31 NOTE — ED Provider Notes (Signed)
[place] EMERGENCY DEPARTMENT AT [facility] Provider Note   CSN: [id number] Arrival date & time: 12/31/22  1640     History  Chief Complaint  Patient presents with   Phillip Gilbert is a 67 y.o. male.   Fall     67 year old male who presents to the emergency department after a ground-level fall.  The patient states that he tripped and fell landing onto his right hip.  Since then he has had significant pain and has been unable to bear weight on the right leg.  Has any head trauma or loss of consciousness.  He is not on anticoagulation.  He endorses sharp shooting pain about the right hip.  Home Medications Prior to Admission medications   Medication Sig Start Date End Date Taking? Authorizing Provider  oxyCODONE-acetaminophen (PERCOCET/ROXICET) 5-325 MG tablet Take 1-2 tablets by mouth every 6 (six) hours as needed for severe pain. 12/31/22  Yes Ernie Avena, MD  acetaminophen (TYLENOL) 325 MG tablet Take 650 mg by mouth every 6 (six) hours as needed for pain.    [provider]  gabapentin (NEURONTIN) 300 MG capsule Take 300 mg by mouth 3 (three) times daily.    [provider]  omeprazole (PRILOSEC) 40 MG capsule Take 40 mg by mouth daily.    [provider]  pantoprazole (PROTONIX) 40 MG tablet Take 1 tablet (40 mg total) by mouth daily. 11/25/19   Mancel Bale, MD      Allergies    Aspirin and Penicillins    Review of Systems   Review of Systems  All other systems reviewed and are negative.   Physical Exam Updated Vital Signs BP 132/75   Pulse 66   Temp 99.7 F (37.6 C) (Oral)   Resp 18   Ht 5\' 9"  (1.753 m)   Wt 76.7 kg   SpO2 94%   BMI 24.96 kg/m  Physical Exam Vitals and nursing note reviewed.  Constitutional:      Appearance: He is well-developed.     Comments: GCS 15, ABC intact  HENT:     Head: Normocephalic.  Eyes:     Conjunctiva/sclera: Conjunctivae normal.  Neck:     Comments: No midline  tenderness to palpation of the cervical spine. ROM intact. Cardiovascular:     Rate and Rhythm: Normal rate and regular rhythm.  Pulmonary:     Effort: Pulmonary effort is normal. No respiratory distress.     Breath sounds: Normal breath sounds.  Chest:     Comments: Chest wall stable and non-tender to AP and lateral compression. Clavicles stable and non-tender to AP compression Abdominal:     Palpations: Abdomen is soft.     Tenderness: There is no abdominal tenderness.     Comments: Pelvis stable to lateral compression.  Musculoskeletal:     Cervical back: Neck supple.     Comments: No midline tenderness to palpation of the thoracic or lumbar spine. Extremities atraumatic with intact ROM with the exception of tenderness about the right greater trochanter.  Skin:    General: Skin is warm and dry.  Neurological:     Mental Status: He is alert.     Comments: CN II-XII grossly intact. Moving all four extremities spontaneously and sensation grossly intact.     ED Results / Procedures / Treatments   Labs (all labs ordered are listed, but only abnormal results are displayed) Labs Reviewed  BASIC METABOLIC PANEL - Abnormal; Notable for the  following components:      Result Value   CO2 21 (*)    Glucose, Bld 100 (*)    Calcium 8.8 (*)    All other components within normal limits  CBC WITH DIFFERENTIAL/PLATELET  PROTIME-INR    EKG None  Radiology CT PELVIS WO CONTRAST  Result Date: 12/31/2022 CLINICAL DATA:  Hip trauma EXAM: CT PELVIS WITHOUT CONTRAST TECHNIQUE: Multidetector CT imaging of the pelvis was performed following the standard protocol without intravenous contrast. RADIATION DOSE REDUCTION: This exam was performed according to the departmental dose-optimization program which includes automated exposure control, adjustment of the mA and/or kV according to patient size and/or use of iterative reconstruction technique. COMPARISON:  Radiographs 12/31/2022 FINDINGS: Urinary  Tract:  No abnormality visualized. Bowel:  No acute bowel wall thickening Vascular/Lymphatic: Moderate aortic atherosclerosis. No aneurysm. No suspicious lymph nodes Reproductive:  Negative prostate Other:  Negative for pelvic effusion or free air Musculoskeletal: Acute mildly comminuted fracture involving the greater trochanter of the right femur. Subtle asymmetric enlargement of the right quadratus femoris muscle suspected to be secondary to edema/soft tissue injury. Soft tissue stranding and edema within the subcutaneous soft tissues overlying the right greater trochanter consistent with contusion. IMPRESSION: Acute mildly comminuted fracture involving the greater trochanter of the right femur. Subtle asymmetric hypodensity and enlargement of right quadratus femoris muscle suspected to be secondary to soft tissue injury. Electronically Signed   By: Jasmine Pang M.D.   On: 12/31/2022 19:52   DG Hip Unilat W or Wo Pelvis 2-3 Views Right  Result Date: 12/31/2022 CLINICAL DATA:  Fall, right hip injury and pain. EXAM: DG HIP (WITH OR WITHOUT PELVIS) 2-3V RIGHT COMPARISON:  CT renal stone protocol 06/12/2020 FINDINGS: There is no evidence of hip fracture or dislocation. There is no evidence of arthropathy or other focal bone abnormality. IMPRESSION: Negative. Electronically Signed   By: Sherron Ales M.D.   On: 12/31/2022 17:30    Procedures Procedures    Medications Ordered in ED Medications  HYDROcodone-acetaminophen (NORCO/VICODIN) 5-325 MG per tablet 1 tablet (1 tablet Oral Given 12/31/22 1935)  HYDROmorphone (DILAUDID) injection 1 mg (1 mg Intravenous Given 12/31/22 2055)    ED Course/ Medical Decision Making/ A&P                             Medical Decision Making Amount and/or Complexity of Data Reviewed Labs: ordered. Radiology: ordered.  Risk Prescription drug management.    67 year old male who presents to the emergency department after a ground-level fall.  The patient states that  he tripped and fell landing onto his right hip.  Since then he has had significant pain and has been unable to bear weight on the right leg.  Has any head trauma or loss of consciousness.  He is not on anticoagulation.  He endorses sharp shooting pain about the right hip.  On arrival, the patient was vitally stable, Presenting with right hip pain after a ground level fall. No head trauma or neck trauma, not on AC.   XR pelvis: IMPRESSION:  Negative.   CT Pelvis: IMPRESSION:  Acute mildly comminuted fracture involving the greater trochanter of  the right femur. Subtle asymmetric hypodensity and enlargement of  right quadratus femoris muscle suspected to be secondary to soft  tissue injury.    I spoke with Dr. Steward Drone who recommend weight bearing as tolerated and pain control, outpatient follow-up in clinic.  Labs unremarkable, the patient  was able to ambulate with crutches. Stable for DC with outpatient Ortho follow-up. Norco provided for pain control.   Final Clinical Impression(s) / ED Diagnoses Final diagnoses:  Fall, initial encounter  Closed nondisplaced fracture of greater trochanter of right femur, initial encounter [facility])    Rx / DC Orders ED Discharge Orders          Ordered    Ambulatory referral to Orthopedics        12/31/22 2120    oxyCODONE-acetaminophen (PERCOCET/ROXICET) 5-325 MG tablet  Every 6 hours PRN        12/31/22 2120              Ernie Avena, MD 12/31/22 2230

## 2022-12-31 NOTE — ED Notes (Signed)
Pt provided with AVS.  Education complete; all questions answered.  Pt leaving ED in stable condition at this time via wheelchair with all belongings. 

## 2022-12-31 NOTE — ED Notes (Signed)
Pt provided with pair of socks per request.

## 2022-12-31 NOTE — ED Triage Notes (Signed)
Pt bib POV c/o fall d/t tripping on the ground and falling on his right hip.

## 2022-12-31 NOTE — Progress Notes (Signed)
Orthopedic Tech Progress Note Patient Details:  Phillip Gilbert Oct 11, 1955 [id number]  Ortho Devices Type of Ortho Device: Crutches Ortho Device/Splint Interventions: Ordered, Application, Adjustment  The patient got up and walked fine with the crutches without training. Post Interventions Patient Tolerated: Well Instructions Provided: Care of device, Adjustment of device  [name] 12/31/2022, 9:51 PM

## 2022-12-31 NOTE — ED Notes (Signed)
Pt able to ambulate with crutches provided by ortho tech.  Pt signed off by ortho tech.

## 2023-01-01 ENCOUNTER — Ambulatory Visit (HOSPITAL_BASED_OUTPATIENT_CLINIC_OR_DEPARTMENT_OTHER): Payer: No Typology Code available for payment source | Admitting: Student

## 2023-01-02 ENCOUNTER — Encounter (HOSPITAL_BASED_OUTPATIENT_CLINIC_OR_DEPARTMENT_OTHER): Payer: Self-pay | Admitting: Student

## 2023-01-02 ENCOUNTER — Ambulatory Visit (INDEPENDENT_AMBULATORY_CARE_PROVIDER_SITE_OTHER): Payer: Self-pay | Admitting: Student

## 2023-01-02 ENCOUNTER — Ambulatory Visit (HOSPITAL_BASED_OUTPATIENT_CLINIC_OR_DEPARTMENT_OTHER): Payer: Self-pay | Admitting: Student

## 2023-01-02 ENCOUNTER — Other Ambulatory Visit (HOSPITAL_BASED_OUTPATIENT_CLINIC_OR_DEPARTMENT_OTHER): Payer: Self-pay

## 2023-01-02 DIAGNOSIS — S72114A Nondisplaced fracture of greater trochanter of right femur, initial encounter for closed fracture: Secondary | ICD-10-CM | POA: Diagnosis not present

## 2023-01-02 MED ORDER — TRAMADOL HCL 50 MG PO TABS
50.0000 mg | ORAL_TABLET | Freq: Four times a day (QID) | ORAL | 0 refills | Status: AC | PRN
  Filled 2023-01-02: qty 20, 5d supply, fill #0

## 2023-01-02 NOTE — Progress Notes (Signed)
Chief Complaint: Right hip pain     History of Present Illness:    Phillip Gilbert is a 67 y.o. male presents today for follow-up of a right hip fracture.  This was diagnosed 2 days ago in the emergency department after he tripped over a fire pit in the yard and fell on his right hip over an exposed root.  Initial radiographs in the ED were not conclusive for fracture however subsequent CT scan did show an acute fracture of the greater trochanter.  Patient was given oxycodone-acetaminophen 5-325 mg which he has been taking every 4-6 hours.  Pain levels at baseline are an 8 out of 10 and increase to 10 out of 10 with movement.  Has been weightbearing with the aid of crutches.  Reports that he only has a day or 2 left of pain medicine.   Surgical History:   None  PMH/PSH/Family History/Social History/Meds/Allergies:    Past Medical History:  Diagnosis Date   Hypertension    Pericarditis    History reviewed. No pertinent surgical history. Social History   Socioeconomic History   Marital status: Single    Spouse name: Not on file   Number of children: Not on file   Years of education: Not on file   Highest education level: Not on file  Occupational History   Not on file  Tobacco Use   Smoking status: Every Day   Smokeless tobacco: Never  Substance and Sexual Activity   Alcohol use: Yes   Drug use: Yes    Types: Marijuana, Cocaine   Sexual activity: Not on file  Other Topics Concern   Not on file  Social History Narrative   Not on file   Social Determinants of Health   Financial Resource Strain: Not on file  Food Insecurity: Not on file  Transportation Needs: Not on file  Physical Activity: Not on file  Stress: Not on file  Social Connections: Not on file   History reviewed. No pertinent family history. Allergies  Allergen Reactions   Aspirin Other (See Comments)    "upsets my stomach."   Penicillins Nausea And Vomiting    Current Outpatient Medications  Medication Sig Dispense Refill   traMADol (ULTRAM) 50 MG tablet Take 1 tablet (50 mg total) by mouth every 6 (six) hours as needed for up to 5 days for severe pain. 20 tablet 0   acetaminophen (TYLENOL) 325 MG tablet Take 650 mg by mouth every 6 (six) hours as needed for pain.     gabapentin (NEURONTIN) 300 MG capsule Take 300 mg by mouth 3 (three) times daily.     omeprazole (PRILOSEC) 40 MG capsule Take 40 mg by mouth daily.     oxyCODONE-acetaminophen (PERCOCET/ROXICET) 5-325 MG tablet Take 1-2 tablets by mouth every 6 (six) hours as needed for severe pain. 15 tablet 0   pantoprazole (PROTONIX) 40 MG tablet Take 1 tablet (40 mg total) by mouth daily. 30 tablet 0   No current facility-administered medications for this visit.   CT PELVIS WO CONTRAST  Result Date: 12/31/2022 CLINICAL DATA:  Hip trauma EXAM: CT PELVIS WITHOUT CONTRAST TECHNIQUE: Multidetector CT imaging of the pelvis was performed following the standard protocol without intravenous contrast. RADIATION DOSE REDUCTION: This exam was performed according to the departmental dose-optimization program which includes automated exposure control,  adjustment of the mA and/or kV according to patient size and/or use of iterative reconstruction technique. COMPARISON:  Radiographs 12/31/2022 FINDINGS: Urinary Tract:  No abnormality visualized. Bowel:  No acute bowel wall thickening Vascular/Lymphatic: Moderate aortic atherosclerosis. No aneurysm. No suspicious lymph nodes Reproductive:  Negative prostate Other:  Negative for pelvic effusion or free air Musculoskeletal: Acute mildly comminuted fracture involving the greater trochanter of the right femur. Subtle asymmetric enlargement of the right quadratus femoris muscle suspected to be secondary to edema/soft tissue injury. Soft tissue stranding and edema within the subcutaneous soft tissues overlying the right greater trochanter consistent with contusion.  IMPRESSION: Acute mildly comminuted fracture involving the greater trochanter of the right femur. Subtle asymmetric hypodensity and enlargement of right quadratus femoris muscle suspected to be secondary to soft tissue injury. Electronically Signed   By: Jasmine Pang M.D.   On: 12/31/2022 19:52   DG Hip Unilat W or Wo Pelvis 2-3 Views Right  Result Date: 12/31/2022 CLINICAL DATA:  Fall, right hip injury and pain. EXAM: DG HIP (WITH OR WITHOUT PELVIS) 2-3V RIGHT COMPARISON:  CT renal stone protocol 06/12/2020 FINDINGS: There is no evidence of hip fracture or dislocation. There is no evidence of arthropathy or other focal bone abnormality. IMPRESSION: Negative. Electronically Signed   By: Sherron Ales M.D.   On: 12/31/2022 17:30    Review of Systems:   A ROS was performed including pertinent positives and negatives as documented in the HPI.  Physical Exam :   Constitutional: NAD and appears stated age Neurological: Alert and oriented Psych: Appropriate affect and cooperative There were no vitals taken for this visit.   Comprehensive Musculoskeletal Exam:    Patient is very tender to palpation over the right greater trochanter.  Passive hip range of motion to 120 degrees flexion and 10 degrees of external and internal rotation.  Right knee range of motion is full with good strength.  Distal neurosensory exam is intact.  Imaging:   Xray review from the emergency department on 12/31/2022 (right hip 3 views): Small lucency noted over superior aspect of greater trochanter.  Otherwise negative.  CT from 12/04/2022 (pelvis without contrast) Fracture of right greater trochanter with minimal displacement   I personally reviewed and interpreted the radiographs.   Assessment:   67 y.o. male with acute right greater trochanter fracture after a fall 2 days ago.  Patient can continue weightbearing as tolerated but I do recommend either continuing with crutches or utilizing a rolling walker.  I would  like to get him in for physical therapy in the next few weeks to help progress weightbearing and strengthening as tolerated.  Patient is still in severe pain while on Percocet however is scheduled to run out within the next few days.  I will send him some tramadol to begin taking after this but discussed not to take both simultaneously.  I will plan to see him back in approximately 4 weeks for reevaluation.  Plan :    -Referral to physical therapy for strengthening and to progress weightbearing -Begin tramadol 50 mg for pain once current Percocet prescription is completed -Return to clinic in 4 weeks for reassessment     I personally saw and evaluated the patient, and participated in the management and treatment plan.  Hazle Nordmann, PA-C Orthopedics  This document was dictated using Conservation officer, historic buildings. A reasonable attempt at proof reading has been made to minimize errors.

## 2023-01-11 NOTE — Therapy (Signed)
OUTPATIENT PHYSICAL THERAPY LOWER EXTREMITY EVALUATION   Patient Name: [facility] MRN: [id number] DOB:[birth year], 67 y.o., 67 y.o., male male Today's Date: 01/12/2023  END OF SESSION:  PT End of Session - 01/12/23 1433     Visit Number 1    Number of Visits 9    Date for PT Re-Evaluation 03/09/23    Authorization Type self pay    Progress Note Due on Visit 10    PT Start Time 1434   late check in   PT Stop Time 1506    PT Time Calculation (min) 32 min    Activity Tolerance Patient tolerated treatment well;Patient limited by pain    Behavior During Therapy [facility] for tasks assessed/performed             Past Medical History:  Diagnosis Date   Hypertension    Pericarditis    History reviewed. No pertinent surgical history. There are no problems to display for this patient.   PCP: [facility]  REFERRING PROVIDER: Victorino December  REFERRING DIAG: [phone] (ICD-10-CM) - Closed nondisplaced fracture of greater trochanter of right femur, initial encounter (HCC)  THERAPY DIAG:  Pain in right hip  Other abnormalities of gait and mobility  Muscle weakness (generalized)  Rationale for Evaluation and Treatment: Rehabilitation  ONSET DATE: 12/31/22  SUBJECTIVE:   SUBJECTIVE STATEMENT: Pt states he tripped over tree roots and fell on his R side. Had immediate pain, began to bruise, went to ED where he was diagnosed with fracture on CT. Reports pretty significant pain consistently since fracture, not improving. States bruise has resolved. Also describes some RLE referral into knee and foot, aching and occasional numbness/tingling (posterior). States this has been present since about a couple days after the fall, states he discussed with his PCP but has not yet discussed with ortho provider. Describes his numbness as stable, non worsening.  PERTINENT HISTORY: HTN, pericarditis, R hip fracture June 2024 Currently wearing zio heart monitor for palpitations PAIN:  Are you  having pain: 9/10 Location/description: R hip Best-worst over past week: 8-"100"/10  - aggravating factors: WB, LE movement, sleeping, lower body dressing  - Easing factors: NWB, medication      PRECAUTIONS: R hip fracture, heart monitor  WEIGHT BEARING RESTRICTIONS: WBAT RLE per referral  FALLS:  Has patient fallen in last 6 months? Yes. Number of falls 1 fall, precipitating episode  LIVING ENVIRONMENT: 1 story house, 3STE B rail Has two roommates, does housework/self care typically but receiving assist as needed  OCCUPATION: retired Electronics engineer - enjoys martial arts, very active, Mining engineer houses, work on car  PLOF: Independent  PATIENT GOALS: get back to being active  NEXT MD VISIT: End of July per EPIC  OBJECTIVE:   DIAGNOSTIC FINDINGS:  CT pelvis 12/30/21 "IMPRESSION: Acute mildly comminuted fracture involving the greater trochanter of the right femur. Subtle asymmetric hypodensity and enlargement of right quadratus femoris muscle suspected to be secondary to soft tissue injury."  PATIENT SURVEYS:  FOTO 37 current, 58 predicted  COGNITION: Overall cognitive status: Within functional limits for tasks assessed     SENSATION: Light touch intact BIL LE although mildly diminished R lateral thigh/calf and medial knee  PALPATION: Tender R lateral hip and posterior aspect of RLE through hamstrings  LOWER EXTREMITY ROM:     Active  Right eval Left eval  Hip flexion 85 deg actively with pain, standing using crutches   Hip extension    Hip internal rotation    Hip external rotation  Knee extension    Knee flexion    (Blank rows = not tested) (Key: WFL = within functional limits not formally assessed, * = concordant pain, s = stiffness/stretching sensation, NT = not tested)  Comments: pain returns to baseline levels with cessation of movement  LOWER EXTREMITY MMT:    MMT Right eval Left eval  Hip flexion    Hip abduction (modified sitting)    Hip internal rotation     Hip external rotation    Knee flexion    Knee extension    Ankle dorsiflexion     (Blank rows = not tested) (Key: WFL = within functional limits not formally assessed, * = concordant pain, s = stiffness/stretching sensation, NT = not tested)  Comments: deferred given acuity of fracture  LOWER EXTREMITY SPECIAL TESTS:  Deferred given presence of fracture  FUNCTIONAL TESTS:  TUG: 25sec w axillary crutches, step to pattern leading with LLE, minimal WB through RLE   GAIT: Distance walked: within clinic Assistive device utilized: Crutches Level of assistance: Modified independence Comments: variable, most often using 4 pt gait pattern with axillary crutches and diminished WB through RLE. Also uses step to pattern with crutches, minimal WB through RLE   TODAY'S TREATMENT:                                                                                                                              [facility] Adult PT Treatment:                                                DATE: 01/12/23 Deferred, eval and education on symptom monitoring/modification, appropriate positioning techniques and strategies to improve safety/comfort with activity, following up with provider   PATIENT EDUCATION:  Education details: Pt education on PT impairments, prognosis, and POC. Informed consent. Rationale for interventions, safety/comfort with mobility, AD use, following up with provider, close monitoring of symptoms and appropriate action Person educated: Patient Education method: Explanation, Demonstration, Tactile cues, Verbal cues Education comprehension: verbalized understanding, returned demonstration, verbal cues required, tactile cues required, and needs further education    HOME EXERCISE PROGRAM: deferred  ASSESSMENT:  CLINICAL IMPRESSION: Pt is a pleasant 67 year old gentleman who arrived to PT evaluation on this date for R hip pain after a fall, imaging indicative of greater trochanteric fracture,  per referral pt WBAT on affected limb. Pt reported difficulty primarily with weightbearing activities and positioning for sleep due to pain. During today's session pt demonstrated limitations in functional mobility and ROM as expected which are likely contributing to difficulty with aforementioned activities. He also endorsed numbness and tingling in posterior aspect of RLE that began a couple days after the fall per pt report - see subjective above. Pt described this as stable and non-worsening since onset and stated he has communicated with  his PCP about it. Encouraged pt to call ortho to discuss as he stated he has not mentioned it to them, this writer also reached out to referring provider via secure chat to notify them re: these symptoms. Education provided on close monitoring and to seek immediate medical attention if any sudden changes/worsening occur, pt verbalizes agreement/understanding. Also provided education on safety w/ activity, appropriate symptom modification strategies and positioning for comfort. Recommend skilled PT to address aforementioned deficits to improve functional independence/tolerance. Pt departs today's session in no acute distress, all voiced questions/concerns addressed appropriately from PT perspective.  Reached out to referring PA at end of session.   OBJECTIVE IMPAIRMENTS: Abnormal gait, decreased activity tolerance, decreased balance, decreased endurance, decreased mobility, difficulty walking, decreased ROM, decreased strength, impaired perceived functional ability, impaired sensation, improper body mechanics, postural dysfunction, and pain.   ACTIVITY LIMITATIONS: carrying, lifting, standing, squatting, sleeping, stairs, transfers, bed mobility, and locomotion level  PARTICIPATION LIMITATIONS: meal prep, cleaning, laundry, shopping, community activity, and occupation  PERSONAL FACTORS: Age, Time since onset of injury/illness/exacerbation, and 3+ comorbidities: HTN,  fracture, hx pericarditis  are also affecting patient's functional outcome.   REHAB POTENTIAL: Good  CLINICAL DECISION MAKING: Evolving/moderate complexity  EVALUATION COMPLEXITY: Moderate   GOALS: Goals reviewed with patient? No - did review role of PT, PT POC  SHORT TERM GOALS: Target date: 02/09/2023 Pt will demonstrate appropriate understanding and performance of initially prescribed HEP in order to facilitate improved independence with management of symptoms.  Baseline: HEP TBD Goal status: INITIAL   2. Pt will score greater than or equal to 48 on FOTO in order to demonstrate improved perception of function due to symptoms.  Baseline: 37  Goal status: INITIAL    LONG TERM GOALS: Target date: 03/09/2023 Pt will score 58 or greater on FOTO in order to demonstrate improved perception of function due to symptoms.  Baseline: 37 Goal status: INITIAL  2.  Pt will be able to perform TUG in less than or equal to 13 sec with LRAD in order to indicate reduced risk of falling (cutoff score for fall risk 13.5 sec in community dwelling older adults per [facility] et al, 2000)  Baseline: 25sec with axillary crutches  Goal status: INITIAL    3. Pt will report/demonstrate ability to navigate at least 3 stairs without UE support and grossly WNL mechanics in order to facilitate improved safety/ease with home access.  Baseline: 3STE home per pt report  Goal status: INITIAL  4. Pt will report at least 50% decrease in overall pain levels in past week in order to facilitate improved tolerance to basic ADLs/mobility.   Baseline: 8-10/10  Goal status: INITIAL    5. Pt will demonstrate appropriate performance of final prescribed HEP in order to facilitate improved self-management of symptoms post-discharge.   Baseline: HEP TBD  Goal status: INITIAL    6. Pt will be able to ambulate at least 56ft with LRAD and les than 3 pt increase in resting pain on NPS in order to facilitate improved community  navigation.  Baseline: within clinic, mechanics as above, painful with use of axillary crutches bilaterally  Goal status: INITIAL  PLAN:  PT FREQUENCY: 1-2x/week  PT DURATION: 8 weeks  PLANNED INTERVENTIONS: Therapeutic exercises, Therapeutic activity, Neuromuscular re-education, Balance training, Gait training, Patient/Family education, Self Care, Stair training, Aquatic Therapy, Dry Needling, Cryotherapy, Moist heat, Taping, Manual therapy, and Re-evaluation  PLAN FOR NEXT SESSION: establish HEP as appropriate. Closely monitor symptoms, particularly numbness/tingling. Gradually progress functional mobility training  and weightbearing tolerance as appropriate.    Ashley Murrain PT, DPT 01/12/2023 4:36 PM

## 2023-01-12 ENCOUNTER — Encounter: Payer: Self-pay | Admitting: Physical Therapy

## 2023-01-12 ENCOUNTER — Other Ambulatory Visit: Payer: Self-pay

## 2023-01-12 ENCOUNTER — Ambulatory Visit (INDEPENDENT_AMBULATORY_CARE_PROVIDER_SITE_OTHER): Payer: No Typology Code available for payment source | Admitting: Physical Therapy

## 2023-01-12 DIAGNOSIS — M25551 Pain in right hip: Secondary | ICD-10-CM | POA: Diagnosis not present

## 2023-01-12 DIAGNOSIS — M6281 Muscle weakness (generalized): Secondary | ICD-10-CM | POA: Diagnosis not present

## 2023-01-12 DIAGNOSIS — R2689 Other abnormalities of gait and mobility: Secondary | ICD-10-CM | POA: Diagnosis not present

## 2023-01-18 ENCOUNTER — Emergency Department (HOSPITAL_BASED_OUTPATIENT_CLINIC_OR_DEPARTMENT_OTHER): Payer: No Typology Code available for payment source | Admitting: Radiology

## 2023-01-18 ENCOUNTER — Emergency Department (HOSPITAL_BASED_OUTPATIENT_CLINIC_OR_DEPARTMENT_OTHER): Payer: No Typology Code available for payment source

## 2023-01-18 ENCOUNTER — Encounter (HOSPITAL_BASED_OUTPATIENT_CLINIC_OR_DEPARTMENT_OTHER): Payer: Self-pay

## 2023-01-18 ENCOUNTER — Other Ambulatory Visit: Payer: Self-pay

## 2023-01-18 ENCOUNTER — Emergency Department (HOSPITAL_BASED_OUTPATIENT_CLINIC_OR_DEPARTMENT_OTHER)
Admission: EM | Admit: 2023-01-18 | Discharge: 2023-01-18 | Disposition: A | Discharge status: Home / Self Care | Payer: No Typology Code available for payment source | Attending: Emergency Medicine | Admitting: Emergency Medicine

## 2023-01-18 ENCOUNTER — Telehealth: Payer: Self-pay | Admitting: Orthopaedic Surgery

## 2023-01-18 DIAGNOSIS — M79661 Pain in right lower leg: Secondary | ICD-10-CM | POA: Insufficient documentation

## 2023-01-18 DIAGNOSIS — M7989 Other specified soft tissue disorders: Secondary | ICD-10-CM | POA: Diagnosis present

## 2023-01-18 DIAGNOSIS — M79604 Pain in right leg: Secondary | ICD-10-CM

## 2023-01-18 LAB — CBC WITH DIFFERENTIAL/PLATELET
Abs Immature Granulocytes: 0.02 10*3/uL (ref 0.00–0.07)
Basophils Absolute: 0.1 10*3/uL (ref 0.0–0.1)
Basophils Relative: 1 %
Eosinophils Absolute: 0.1 10*3/uL (ref 0.0–0.5)
Eosinophils Relative: 1 %
HCT: 50.1 % (ref 39.0–52.0)
Hemoglobin: 16.4 g/dL (ref 13.0–17.0)
Immature Granulocytes: 0 %
Lymphocytes Relative: 29 %
Lymphs Abs: 2 10*3/uL (ref 0.7–4.0)
MCH: 29.1 pg (ref 26.0–34.0)
MCHC: 32.7 g/dL (ref 30.0–36.0)
MCV: 88.8 fL (ref 80.0–100.0)
Monocytes Absolute: 0.3 10*3/uL (ref 0.1–1.0)
Monocytes Relative: 5 %
Neutro Abs: 4.4 10*3/uL (ref 1.7–7.7)
Neutrophils Relative %: 64 %
Platelets: 284 10*3/uL (ref 150–400)
RBC: 5.64 MIL/uL (ref 4.22–5.81)
RDW: 14.3 % (ref 11.5–15.5)
WBC: 7 10*3/uL (ref 4.0–10.5)
nRBC: 0 % (ref 0.0–0.2)

## 2023-01-18 LAB — BASIC METABOLIC PANEL
Anion gap: 5 (ref 5–15)
BUN: 18 mg/dL (ref 8–23)
CO2: 29 mmol/L (ref 22–32)
Calcium: 9.3 mg/dL (ref 8.9–10.3)
Chloride: 107 mmol/L (ref 98–111)
Creatinine, Ser: 1.17 mg/dL (ref 0.61–1.24)
GFR, Estimated: >60 mL/min (ref >60)
Glucose, Bld: 119 mg/dL — ABNORMAL HIGH (ref 70–99)
Potassium: 3.9 mmol/L (ref 3.5–5.1)
Sodium: 141 mmol/L (ref 135–145)

## 2023-01-18 MED ORDER — OXYCODONE-ACETAMINOPHEN 5-325 MG PO TABS: 1.0000 | ORAL_TABLET | Freq: Four times a day (QID) | ORAL | 0 refills | Status: AC | PRN

## 2023-01-18 MED ORDER — CELECOXIB 200 MG PO CAPS: 200.0000 mg | ORAL_CAPSULE | Freq: Two times a day (BID) | ORAL | 0 refills | Status: AC

## 2023-01-18 NOTE — ED Notes (Signed)
 RN reviewed discharge instructions with pt. Pt verbalized understanding and had no further questions. VSS upon discharge.  

## 2023-01-18 NOTE — ED Notes (Signed)
Pt ambulatory to and from bathroom.  

## 2023-01-18 NOTE — ED Provider Notes (Signed)
[facility] Provider Note   CSN: [id number] Arrival date & time: 01/18/23  1656     History Chief Complaint  Patient presents with   Bleeding/Bruising    HPI Phillip [facility] is a 67 y.o. male presenting for chief complaint of right lower extremity swelling.  He had a recent fall event was diagnosed with a proximal femur fracture.  It was well aligned and he is nonweightbearing.  He states that his right calf started swelling and causing him discomfort.  He denies fevers chills nausea vomiting syncope or shortness of breath.  Otherwise ambulatory tolerating p.o. intake.  No known sick contacts. Pain is mostly in the calf radiating behind his right knee.  He has an area of bruising over his right superior lateral calf.   Patient's recorded medical, surgical, social, medication list and allergies were reviewed in the Snapshot window as part of the initial history.   Review of Systems   Review of Systems  Constitutional:  Negative for chills and fever.  HENT:  Negative for ear pain and sore throat.   Eyes:  Negative for pain and visual disturbance.  Respiratory:  Negative for cough and shortness of breath.   Cardiovascular:  Positive for leg swelling. Negative for chest pain and palpitations.  Gastrointestinal:  Negative for abdominal pain and vomiting.  Genitourinary:  Negative for dysuria and hematuria.  Musculoskeletal:  Negative for arthralgias and back pain.  Skin:  Negative for color change and rash.  Neurological:  Negative for seizures and syncope.  All other systems reviewed and are negative.   Physical Exam Updated Vital Signs BP (!) 162/89   Pulse (!) 55   Temp 98.4 F (36.9 C) (Temporal)   Resp 18   Ht 5\' 9"  (1.753 m)   Wt 77.1 kg   SpO2 99%   BMI 25.10 kg/m  Physical Exam Vitals and nursing note reviewed.  Constitutional:      General: He is not in acute distress.    Appearance: He is well-developed.  HENT:      Head: Normocephalic and atraumatic.  Eyes:     Conjunctiva/sclera: Conjunctivae normal.  Cardiovascular:     Rate and Rhythm: Normal rate and regular rhythm.     Heart sounds: No murmur heard. Pulmonary:     Effort: Pulmonary effort is normal. No respiratory distress.     Breath sounds: Normal breath sounds.  Abdominal:     Palpations: Abdomen is soft.     Tenderness: There is no abdominal tenderness.  Musculoskeletal:        General: Swelling present.     Cervical back: Neck supple.     Right lower leg: Edema present.  Skin:    General: Skin is warm and dry.     Capillary Refill: Capillary refill takes less than 2 seconds.  Neurological:     Mental Status: He is alert.  Psychiatric:        Mood and Affect: Mood normal.      ED Course/ Medical Decision Making/ A&P    Procedures Procedures   Medications Ordered in ED Medications - No data to display  Medical Decision Making:    Phillip [facility] is a 67 y.o. male who presented to the ED today with chief complaint of right leg pain. Detailed above.     Complete initial physical exam performed, notably the patient  was stable no acute distress.  Does have swelling and tenderness over his right shin and calf.Marland Kitchen  Reviewed and confirmed nursing documentation for past medical history, family history, social history.    Initial Assessment:   With the patient's presentation of leg pain and swelling, most likely diagnosis is musculoskeletal injury from his recent fall. Other diagnoses were considered including (but not limited to) DVT, underlying fracture, compartment syndrome from his femur fracture. These are considered less likely due to history of present illness and physical exam findings.   This is most consistent with an acute life/limb threatening illness complicated by underlying chronic conditions.  Initial Plan:  X-ray right lower extremity to evaluate for structural etiology DVT study right lower extremity to  evaluate for vascular etiology Screening labs including CBC and Metabolic panel to evaluate for infectious or metabolic etiology of disease.  Objective evaluation as below reviewed with plan for close reassessment  Initial Study Results:   Laboratory  All laboratory results reviewed without evidence of clinically relevant pathology.     Radiology  All images reviewed independently. Agree with radiology report at this time.   US Venous Img Lower Unilateral Right  Result Date: 01/18/2023 CLINICAL DATA:  Leg swelling EXAM: Right LOWER EXTREMITY VENOUS DOPPLER ULTRASOUND TECHNIQUE: Gray-scale sonography with compression, as well as color and duplex ultrasound, were performed to evaluate the deep venous system(s) from the level of the common femoral vein through the popliteal and proximal calf veins. COMPARISON:  None Available. FINDINGS: VENOUS Normal compressibility of the common femoral, superficial femoral, and popliteal veins, as well as the visualized calf veins. Visualized portions of profunda femoral vein and great saphenous vein unremarkable. No filling defects to suggest DVT on grayscale or color Doppler imaging. Doppler waveforms show normal direction of venous flow, normal respiratory plasticity and response to augmentation. Limited views of the contralateral common femoral vein are unremarkable. OTHER None. Limitations: none IMPRESSION: Negative. Electronically Signed   By: [facility] M.D.   On: 01/18/2023 20:01   DG Knee 2 Views Right  Result Date: 01/18/2023 CLINICAL DATA:  Right knee bruising, swelling and tenderness. EXAM: RIGHT KNEE - 1-2 VIEW COMPARISON:  None Available. FINDINGS: No evidence of an acute fracture or dislocation. Mild to moderate severity patellofemoral and medial tibiofemoral compartment space narrowing is seen. A very small joint effusion is suspected. IMPRESSION: Mild to moderate severity degenerative changes with a very small joint effusion. Electronically Signed    By: [facility] M.D.   On: 01/18/2023 19:13     Reassessment and Plan:   Reevaluated after 4 hours of observation in the emergency room.  Leg swelling has not worsened.  Appears inconsistent with compartment syndrome.  Ultrasound and x-ray grossly nondiagnostic for any acute injury. Given reassuring blood work reassuring findings, patient stable to follow-up with her orthopedic provider in outpatient setting, multimodal pain control discussed and prescribed for patient continue to use.   Disposition:  I have considered need for hospitalization, however, considering all of the above, I believe this patient is stable for discharge at this time.  Patient/family educated about specific return precautions for given chief complaint and symptoms.  Patient/family educated about follow-up with PCP and ORTHO.     Patient/family expressed understanding of return precautions and need for follow-up. Patient spoken to regarding all imaging and laboratory results and appropriate follow up for these results. All education provided in verbal form with additional information in written form. Time was allowed for answering of patient questions. Patient discharged.    Emergency Department Medication Summary:   Medications - No data to display  Clinical Impression:  1. Pain of right lower extremity      Discharge   Final Clinical Impression(s) / ED Diagnoses Final diagnoses:  Pain of right lower extremity    Rx / DC Orders ED Discharge Orders          Ordered    oxyCODONE-acetaminophen (PERCOCET/ROXICET) 5-325 MG tablet  Every 6 hours PRN        01/18/23 2155    celecoxib (CELEBREX) 200 MG capsule  2 times daily        01/18/23 2155              Glyn Ade, MD 01/18/23 2155

## 2023-01-18 NOTE — Telephone Encounter (Signed)
FYI  Spoke with patient he advised he has a large bruise on the back side of his leg/calf area. Spoke with Macon Large she will see the patient today at same day clinic. I advised patient of this and patient said [facility] [place] [facility] Patient said he has an appointment there. I advised patient to still go to ER at [facility] to be seen there because the same day clinic will be closed by the time he return to [place]. Patient said he will go to the ER when he finish his appointment.

## 2023-01-18 NOTE — ED Triage Notes (Signed)
Patient here POV from Home.  Endorses Bruising to Right Posterior Leg that was noted a few days ago. Some Tenderness, Swelling. No Known Trauma to area but did injure right hip 2 weeks ago.   NAD Noted during Triage. A&Ox4. LKG40. Ambulatory with Crutches.

## 2023-01-18 NOTE — ED Notes (Signed)
ED Provider at bedside. 

## 2023-01-19 ENCOUNTER — Encounter: Payer: No Typology Code available for payment source | Admitting: Physical Therapy

## 2023-01-19 ENCOUNTER — Telehealth: Payer: Self-pay | Admitting: Student

## 2023-01-19 NOTE — Telephone Encounter (Signed)
Patient called stating he went to the hospital for  in the back of the right knee.There is pain and bruising above the right knee as well. CB#[phone].au

## 2023-01-19 NOTE — Telephone Encounter (Signed)
Called pt, he will see Jean Rosenthal on monday

## 2023-01-22 ENCOUNTER — Ambulatory Visit (HOSPITAL_BASED_OUTPATIENT_CLINIC_OR_DEPARTMENT_OTHER): Payer: No Typology Code available for payment source | Admitting: Student

## 2023-01-22 DIAGNOSIS — S72114A Nondisplaced fracture of greater trochanter of right femur, initial encounter for closed fracture: Secondary | ICD-10-CM

## 2023-01-22 DIAGNOSIS — M25561 Pain in right knee: Secondary | ICD-10-CM | POA: Diagnosis not present

## 2023-01-23 NOTE — Progress Notes (Signed)
Chief Complaint: Right knee and hip pain     History of Present Illness:   01/22/23: Patient presents today for follow-up evaluation with a chief complaint of right knee pain.  This began bothering him shortly after his last visit.  States that most of his pain is located in the posterior knee and is 9 out of 10 in severity.  He did also develop a bruise over the superior lateral aspect of his calf which has mostly resolved.  He was seen 4 days ago in the emergency department for pain and swelling of the right lower leg.  Doppler ultrasound was negative for DVT.  He was given prescriptions for Celebrex and Percocet, however he reports being unable to fill these due to financial reasons.  He has just been taking naproxen for pain relief.  He has been using crutches to restrict weightbearing.   01/02/23: Garvin Ellena is a 67 y.o. male presents today for follow-up of a right hip fracture.  This was diagnosed 2 days ago in the emergency department after he tripped over a fire pit in the yard and fell on his right hip over an exposed root.  Initial radiographs in the ED were not conclusive for fracture however subsequent CT scan did show an acute fracture of the greater trochanter.  Patient was given oxycodone-acetaminophen 5-325 mg which he has been taking every 4-6 hours.  Pain levels at baseline are an 8 out of 10 and increase to 10 out of 10 with movement.  Has been weightbearing with the aid of crutches.  Reports that he only has a day or 2 left of pain medicine.   Surgical History:   None  PMH/PSH/Family History/Social History/Meds/Allergies:    Past Medical History:  Diagnosis Date   Hypertension    Pericarditis    No past surgical history on file. Social History   Socioeconomic History   Marital status: Single    Spouse name: Not on file   Number of children: Not on file   Years of education: Not on file   Highest education level: Not on file   Occupational History   Not on file  Tobacco Use   Smoking status: Every Day    Current packs/day: 0.15    Types: Cigarettes   Smokeless tobacco: Never  Substance and Sexual Activity   Alcohol use: Yes    Comment: Beer Occ   Drug use: Not Currently    Types: Marijuana, Cocaine   Sexual activity: Not on file  Other Topics Concern   Not on file  Social History Narrative   Not on file   Social Determinants of Health   Financial Resource Strain: Not on file  Food Insecurity: Not on file  Transportation Needs: Not on file  Physical Activity: Not on file  Stress: Not on file  Social Connections: Not on file   No family history on file. Allergies  Allergen Reactions   Aspirin Other (See Comments)    "upsets my stomach."   Penicillins Nausea And Vomiting   Current Outpatient Medications  Medication Sig Dispense Refill   celecoxib (CELEBREX) 200 MG capsule Take 1 capsule (200 mg total) by mouth 2 (two) times daily. 20 capsule 0   gabapentin (NEURONTIN) 300 MG capsule Take 300 mg by mouth 3 (three) times daily.  omeprazole (PRILOSEC) 40 MG capsule Take 40 mg by mouth daily.     oxyCODONE-acetaminophen (PERCOCET/ROXICET) 5-325 MG tablet Take 1-2 tablets by mouth every 6 (six) hours as needed for severe pain. 15 tablet 0   pantoprazole (PROTONIX) 40 MG tablet Take 1 tablet (40 mg total) by mouth daily. 30 tablet 0   No current facility-administered medications for this visit.   No results found.  Review of Systems:   A ROS was performed including pertinent positives and negatives as documented in the HPI.  Physical Exam :   Constitutional: NAD and appears stated age Neurological: Alert and oriented Psych: Appropriate affect and cooperative There were no vitals taken for this visit.   Comprehensive Musculoskeletal Exam:    No significant swelling or ecchymosis noted surrounding the right knee.  Positive bilateral joint line tenderness that is worse medially.  Some  tenderness over the MCL.  Active range of motion 0 to 130 degrees.  No palpable Baker's cyst.  Negative Lachman.  No laxity with varus or valgus stress testing.  Imaging:   Xray review from the emergency department on 01/18/2023 (right knee 2 views): Some medial joint space narrowing with small peripheral osteophytes    I personally reviewed and interpreted the radiographs.   Assessment:   67 y.o. male 3 weeks status post right greater trochanter fracture presenting with right knee pain.  Recent x-ray suggests mild to moderate osteoarthritis and Doppler ultrasound was negative for DVT.  At this point I would consider a nerve issue versus an MCL or meniscus injury as contributors or causes for his symptoms.  My main goal today is to help get him some pain relief and to continue with close monitoring.  He did well with the tramadol I prescribed at his initial visit so I will send him in a refill for pain.  He can continue with scheduled PT appointment on 7/25 if tolerated and I will plan to see him back for follow-up next week.  Plan :    -Tramadol prescription for pain relief -Continue with physical therapy as tolerated and return to clinic next week for reevaluation    I personally saw and evaluated the patient, and participated in the management and treatment plan.  Hazle Nordmann, PA-C Orthopedics  This document was dictated using Conservation officer, historic buildings. A reasonable attempt at proof reading has been made to minimize errors.

## 2023-01-25 ENCOUNTER — Ambulatory Visit: Payer: No Typology Code available for payment source | Admitting: Rehabilitative and Restorative Service Providers"

## 2023-01-25 ENCOUNTER — Encounter: Payer: Self-pay | Admitting: Rehabilitative and Restorative Service Providers"

## 2023-01-25 DIAGNOSIS — M6281 Muscle weakness (generalized): Secondary | ICD-10-CM | POA: Diagnosis not present

## 2023-01-25 DIAGNOSIS — R2689 Other abnormalities of gait and mobility: Secondary | ICD-10-CM | POA: Diagnosis not present

## 2023-01-25 DIAGNOSIS — M25551 Pain in right hip: Secondary | ICD-10-CM

## 2023-01-25 NOTE — Therapy (Signed)
OUTPATIENT PHYSICAL THERAPY LOWER EXTREMITY TREATMENT   Patient Name: Phillip Gilbert MRN: [id number] DOB:February 14, 1956, 67 y.o., male Today's Date: 01/25/2023  END OF SESSION:  PT End of Session - 01/25/23 1607     Visit Number 2    Number of Visits 9    Date for PT Re-Evaluation 03/09/23    Authorization Type self pay    Progress Note Due on Visit 10    PT Start Time 1516    PT Stop Time 1600    PT Time Calculation (min) 44 min    Activity Tolerance Patient tolerated treatment well;No increased pain    Behavior During Therapy WFL for tasks assessed/performed              Past Medical History:  Diagnosis Date   Hypertension    Pericarditis    History reviewed. No pertinent surgical history. There are no problems to display for this patient.   PCP: [facility]  REFERRING PROVIDER: Victorino December  REFERRING DIAG: [phone] (ICD-10-CM) - Closed nondisplaced fracture of greater trochanter of right femur, initial encounter (HCC)  THERAPY DIAG:  Pain in right hip  Other abnormalities of gait and mobility  Muscle weakness (generalized)  Rationale for Evaluation and Treatment: Rehabilitation  ONSET DATE: 12/31/22  SUBJECTIVE:   SUBJECTIVE STATEMENT: Phillip Gilbert reports pain is still significant and that he is looking forward to starting his PT exercises (deferred at evaluation).   Pt states he tripped over tree roots and fell on his R side. Had immediate pain, began to bruise, went to ED where he was diagnosed with fracture on CT. Reports pretty significant pain consistently since fracture, not improving. States bruise has resolved. Also describes some RLE referral into knee and foot, aching and occasional numbness/tingling (posterior). States this has been present since about a couple days after the fall, states he discussed with his PCP but has not yet discussed with ortho provider. Describes his numbness as stable, non worsening.  PERTINENT HISTORY: HTN,  pericarditis, R hip fracture June 2024 Currently wearing zio heart monitor for palpitations PAIN:  Are you having pain: Up to 9/10 Location/description: R hip Best-worst over past week: 8-9/10  - aggravating factors: WB, LE movement, sleeping (particularly on the right side), lower body dressing  - Easing factors: NWB, medication      PRECAUTIONS: R hip fracture, heart monitor  WEIGHT BEARING RESTRICTIONS: WBAT RLE per referral  FALLS:  Has patient fallen in last 6 months? Yes. Number of falls 1 fall, precipitating episode  LIVING ENVIRONMENT: 1 story house, 3STE B rail Has two roommates, does housework/self care typically but receiving assist as needed  OCCUPATION: retired Electronics engineer - enjoys martial arts, very active, Mining engineer houses, work on car  PLOF: Independent  PATIENT GOALS: get back to being active  NEXT MD VISIT: End of July per EPIC  OBJECTIVE:   DIAGNOSTIC FINDINGS:  CT pelvis 12/30/21 "IMPRESSION: Acute mildly comminuted fracture involving the greater trochanter of the right femur. Subtle asymmetric hypodensity and enlargement of right quadratus femoris muscle suspected to be secondary to soft tissue injury."  PATIENT SURVEYS:  FOTO 37 current, 58 predicted  COGNITION: Overall cognitive status: Within functional limits for tasks assessed     SENSATION: Light touch intact BIL LE although mildly diminished R lateral thigh/calf and medial knee  PALPATION: Tender R lateral hip and posterior aspect of RLE through hamstrings  LOWER EXTREMITY ROM:     Active  Right eval Left eval  Hip flexion 85 deg  actively with pain, standing using crutches   Hip extension    Hip internal rotation    Hip external rotation    Knee extension    Knee flexion    (Blank rows = not tested) (Key: WFL = within functional limits not formally assessed, * = concordant pain, s = stiffness/stretching sensation, NT = not tested)  Comments: pain returns to baseline levels with cessation  of movement  LOWER EXTREMITY MMT:    MMT Right eval Left eval  Hip flexion    Hip abduction (modified sitting)    Hip internal rotation    Hip external rotation    Knee flexion    Knee extension    Ankle dorsiflexion     (Blank rows = not tested) (Key: WFL = within functional limits not formally assessed, * = concordant pain, s = stiffness/stretching sensation, NT = not tested)  Comments: deferred given acuity of fracture  LOWER EXTREMITY SPECIAL TESTS:  Deferred given presence of fracture  FUNCTIONAL TESTS:  TUG: 25sec w axillary crutches, step to pattern leading with LLE, minimal WB through RLE   GAIT: Distance walked: within clinic Assistive device utilized: Crutches Level of assistance: Modified independence Comments: variable, most often using 4 pt gait pattern with axillary crutches and diminished WB through RLE. Also uses step to pattern with crutches, minimal WB through RLE   TODAY'S TREATMENT:                                                                                                                              [facility] Adult PT Treatment:                                                DATE: 01/12/23 01/25/2023 Single knee to chest 4 x 20 seconds Supine hamstrings stretch 4 x 20 seconds Gluteal stretch 4 x 20 seconds Figure 4 stretch 4 x 20 seconds Yoga bridge 10 x 5 seconds Side lie clams (lie left with pillows between knees) 2 sets of 10 x 3 seconds    Eval: Deferred, eval and education on symptom monitoring/modification, appropriate positioning techniques and strategies to improve safety/comfort with activity, following up with provider   PATIENT EDUCATION:  Education details: Pt education on PT impairments, prognosis, and POC. Informed consent. Rationale for interventions, safety/comfort with mobility, AD use, following up with provider, close monitoring of symptoms and appropriate action Person educated: Patient Education method: Explanation,  Demonstration, Tactile cues, Verbal cues Education comprehension: verbalized understanding, returned demonstration, verbal cues required, tactile cues required, and needs further education    HOME EXERCISE PROGRAM: deferred  ASSESSMENT:  CLINICAL IMPRESSION: Aimee was started on a comprehensive hip AROM, flexibility and strengthening program to address impairments noted at evaluation.  Jayshaun did a good job with all activities started today and was given appropriate  handouts to guide him at home.  Orbie follows up with Hazle Nordmann PA-C next week and with me in 1 week.  Based on any imaging and his doctor's appointment, we will make any necessary changes to his home flexibility, AROM and strength program to meet long-term goals.  OBJECTIVE IMPAIRMENTS: Abnormal gait, decreased activity tolerance, decreased balance, decreased endurance, decreased mobility, difficulty walking, decreased ROM, decreased strength, impaired perceived functional ability, impaired sensation, improper body mechanics, postural dysfunction, and pain.   ACTIVITY LIMITATIONS: carrying, lifting, standing, squatting, sleeping, stairs, transfers, bed mobility, and locomotion level  PARTICIPATION LIMITATIONS: meal prep, cleaning, laundry, shopping, community activity, and occupation  PERSONAL FACTORS: Age, Time since onset of injury/illness/exacerbation, and 3+ comorbidities: HTN, fracture, hx pericarditis  are also affecting patient's functional outcome.   REHAB POTENTIAL: Good  CLINICAL DECISION MAKING: Evolving/moderate complexity  EVALUATION COMPLEXITY: Moderate   GOALS: Goals reviewed with patient? No - did review role of PT, PT POC  SHORT TERM GOALS: Target date: 02/09/2023 Pt will demonstrate appropriate understanding and performance of initially prescribed HEP in order to facilitate improved independence with management of symptoms.  Baseline: Hstarted 01/25/2023 Goal status: INITIAL   2. Pt will score  greater than or equal to 48 on FOTO in order to demonstrate improved perception of function due to symptoms.  Baseline: 37  Goal status: INITIAL    LONG TERM GOALS: Target date: 03/09/2023 Pt will score 58 or greater on FOTO in order to demonstrate improved perception of function due to symptoms.  Baseline: 37 Goal status: INITIAL  2.  Pt will be able to perform TUG in less than or equal to 13 sec with LRAD in order to indicate reduced risk of falling (cutoff score for fall risk 13.5 sec in community dwelling older adults per [facility] et al, 2000)  Baseline: 25sec with axillary crutches  Goal status: INITIAL    3. Pt will report/demonstrate ability to navigate at least 3 stairs without UE support and grossly WNL mechanics in order to facilitate improved safety/ease with home access.  Baseline: 3STE home per pt report  Goal status: INITIAL  4. Pt will report at least 50% decrease in overall pain levels in past week in order to facilitate improved tolerance to basic ADLs/mobility.   Baseline: 8-10/10  Goal status: INITIAL    5. Pt will demonstrate appropriate performance of final prescribed HEP in order to facilitate improved self-management of symptoms post-discharge.   Baseline: HEP TBD  Goal status: INITIAL    6. Pt will be able to ambulate at least 564ft with LRAD and les than 3 pt increase in resting pain on NPS in order to facilitate improved community navigation.  Baseline: within clinic, mechanics as above, painful with use of axillary crutches bilaterally  Goal status: INITIAL  PLAN:  PT FREQUENCY: 1-2x/week  PT DURATION: 8 weeks  PLANNED INTERVENTIONS: Therapeutic exercises, Therapeutic activity, Neuromuscular re-education, Balance training, Gait training, Patient/Family education, Self Care, Stair training, Aquatic Therapy, Dry Needling, Cryotherapy, Moist heat, Taping, Manual therapy, and Re-evaluation  PLAN FOR NEXT SESSION: Review and modify HEP as appropriate. Any  numbness/tingling?   Cherlyn Cushing PT, MPT 01/25/2023 4:14 PM

## 2023-01-30 ENCOUNTER — Ambulatory Visit (INDEPENDENT_AMBULATORY_CARE_PROVIDER_SITE_OTHER): Payer: No Typology Code available for payment source

## 2023-01-30 ENCOUNTER — Encounter (HOSPITAL_BASED_OUTPATIENT_CLINIC_OR_DEPARTMENT_OTHER): Payer: Self-pay | Admitting: Student

## 2023-01-30 ENCOUNTER — Ambulatory Visit (HOSPITAL_BASED_OUTPATIENT_CLINIC_OR_DEPARTMENT_OTHER): Payer: No Typology Code available for payment source | Admitting: Student

## 2023-01-30 DIAGNOSIS — S72114A Nondisplaced fracture of greater trochanter of right femur, initial encounter for closed fracture: Secondary | ICD-10-CM

## 2023-01-30 DIAGNOSIS — S72114D Nondisplaced fracture of greater trochanter of right femur, subsequent encounter for closed fracture with routine healing: Secondary | ICD-10-CM

## 2023-01-30 NOTE — Progress Notes (Signed)
Chief Complaint: Right hip pain     History of Present Illness:   01/30/23: Patient is 4 weeks status post fracture of the right greater trochanter.  Overall he reports some improvement in symptoms, however his pain remains severe.  He has only been taking naproxen which has helped some.  He is using crutches and reports placing 40 to 50% of normal weight through his right leg.  Does still report lateral sided hip pain radiating down the front leg to the knee.  Began outpatient rehab late last week with PT and states that this has been going well.  Still has been unable to pick up tramadol or previous Percocet prescriptions due to financial concerns.    01/02/23: Phillip Gilbert is a 67 y.o. male presents today for follow-up of a right hip fracture.  This was diagnosed 2 days ago in the emergency department after he tripped over a fire pit in the yard and fell on his right hip over an exposed root.  Initial radiographs in the ED were not conclusive for fracture however subsequent CT scan did show an acute fracture of the greater trochanter.  Patient was given oxycodone-acetaminophen 5-325 mg which he has been taking every 4-6 hours.  Pain levels at baseline are an 8 out of 10 and increase to 10 out of 10 with movement.  Has been weightbearing with the aid of crutches.  Reports that he only has a day or 2 left of pain medicine.   Surgical History:   None  PMH/PSH/Family History/Social History/Meds/Allergies:    Past Medical History:  Diagnosis Date   Hypertension    Pericarditis    History reviewed. No pertinent surgical history. Social History   Socioeconomic History   Marital status: Single    Spouse name: Not on file   Number of children: Not on file   Years of education: Not on file   Highest education level: Not on file  Occupational History   Not on file  Tobacco Use   Smoking status: Every Day    Current packs/day: 0.15    Types:  Cigarettes   Smokeless tobacco: Never  Substance and Sexual Activity   Alcohol use: Yes    Comment: Beer Occ   Drug use: Not Currently    Types: Marijuana, Cocaine   Sexual activity: Not on file  Other Topics Concern   Not on file  Social History Narrative   Not on file   Social Determinants of Health   Financial Resource Strain: Not on file  Food Insecurity: Not on file  Transportation Needs: Not on file  Physical Activity: Not on file  Stress: Not on file  Social Connections: Not on file   History reviewed. No pertinent family history. Allergies  Allergen Reactions   Aspirin Other (See Comments)    "upsets my stomach."   Penicillins Nausea And Vomiting   Current Outpatient Medications  Medication Sig Dispense Refill   celecoxib (CELEBREX) 200 MG capsule Take 1 capsule (200 mg total) by mouth 2 (two) times daily. 20 capsule 0   gabapentin (NEURONTIN) 300 MG capsule Take 300 mg by mouth 3 (three) times daily.     omeprazole (PRILOSEC) 40 MG capsule Take 40 mg by mouth daily.     oxyCODONE-acetaminophen (PERCOCET/ROXICET) 5-325 MG tablet Take 1-2 tablets by mouth  every 6 (six) hours as needed for severe pain. 15 tablet 0   pantoprazole (PROTONIX) 40 MG tablet Take 1 tablet (40 mg total) by mouth daily. 30 tablet 0   No current facility-administered medications for this visit.   No results found.  Review of Systems:   A ROS was performed including pertinent positives and negatives as documented in the HPI.  Physical Exam :   Constitutional: NAD and appears stated age Neurological: Alert and oriented Psych: Appropriate affect and cooperative There were no vitals taken for this visit.   Comprehensive Musculoskeletal Exam:    Tenderness palpation over the greater trochanter of the right hip.  Passive range of motion to 110 degrees flexion, 20 degrees external rotation, and 10 degrees internal rotation.  Knee flexion/extension and ankle dorsiflexion/plantarflexion  strength is 5/5.  Distal neurosensory exam intact.   Imaging:   Xray (right hip 4 views): Nondisplaced fracture of the greater trochanter   I personally reviewed and interpreted the radiographs.   Assessment:   67 y.o. male 4 weeks status post right greater trochanter fracture.  Overall I feel like he is progressing well and will benefit significantly from physical therapy.  Fracture is apparent on today's x-ray but with no further displacement.  Will have him continue to work on strengthening and progressing weightbearing as tolerated.  At this point my main concern is pain control, as this remains 10 out of 10.  He is currently taking naproxen, but does state that he should be able to pick up his tramadol prescription in the next day or two.  Plan to continue with PT and I would like to see him back for reevaluation in approximately 3 weeks.  Plan :    -Continue with physical therapy for strengthening and progression of weight-bearing -Return to clinic in 3 weeks for reassessment    I personally saw and evaluated the patient, and participated in the management and treatment plan.  Hazle Nordmann, PA-C Orthopedics  This document was dictated using Conservation officer, historic buildings. A reasonable attempt at proof reading has been made to minimize errors.

## 2023-01-31 ENCOUNTER — Other Ambulatory Visit (HOSPITAL_BASED_OUTPATIENT_CLINIC_OR_DEPARTMENT_OTHER): Payer: Self-pay | Admitting: Student

## 2023-01-31 ENCOUNTER — Other Ambulatory Visit (HOSPITAL_BASED_OUTPATIENT_CLINIC_OR_DEPARTMENT_OTHER): Payer: Self-pay

## 2023-01-31 MED ORDER — TRAMADOL HCL 50 MG PO TABS
50.0000 mg | ORAL_TABLET | Freq: Four times a day (QID) | ORAL | 0 refills | Status: AC | PRN
  Filled 2023-01-31: qty 20, 5d supply, fill #0

## 2023-02-01 ENCOUNTER — Ambulatory Visit: Payer: No Typology Code available for payment source | Admitting: Rehabilitative and Restorative Service Providers"

## 2023-02-01 ENCOUNTER — Encounter: Payer: Self-pay | Admitting: Rehabilitative and Restorative Service Providers"

## 2023-02-01 DIAGNOSIS — M6281 Muscle weakness (generalized): Secondary | ICD-10-CM

## 2023-02-01 DIAGNOSIS — M25551 Pain in right hip: Secondary | ICD-10-CM | POA: Diagnosis not present

## 2023-02-01 DIAGNOSIS — R2689 Other abnormalities of gait and mobility: Secondary | ICD-10-CM

## 2023-02-01 NOTE — Therapy (Signed)
OUTPATIENT PHYSICAL THERAPY LOWER EXTREMITY TREATMENT   Patient Name: Phillip Gilbert MRN: [id number] DOB:Sep 29, 1955, 67 y.o., male Today's Date: 02/01/2023  END OF SESSION:  PT End of Session - 02/01/23 1530     Visit Number 3    Number of Visits 9    Date for PT Re-Evaluation 03/09/23    Authorization Type self pay    Progress Note Due on Visit 10    PT Start Time 1517    PT Stop Time 1555    PT Time Calculation (min) 38 min    Activity Tolerance Patient tolerated treatment well;No increased pain    Behavior During Therapy WFL for tasks assessed/performed             Past Medical History:  Diagnosis Date   Hypertension    Pericarditis    History reviewed. No pertinent surgical history. There are no problems to display for this patient.   PCP: [facility]  REFERRING PROVIDER: Victorino December  REFERRING DIAG: [phone] (ICD-10-CM) - Closed nondisplaced fracture of greater trochanter of right femur, initial encounter (HCC)  THERAPY DIAG:  Pain in right hip  Other abnormalities of gait and mobility  Muscle weakness (generalized)  Rationale for Evaluation and Treatment: Rehabilitation  ONSET DATE: 12/31/22  SUBJECTIVE:   SUBJECTIVE STATEMENT: [name] reports pain is better controlled with his new pain medication.  He reports consistent HEP compliance.   Pt states he tripped over tree roots and fell on his R side. Had immediate pain, began to bruise, went to ED where he was diagnosed with fracture on CT. Reports pretty significant pain consistently since fracture, not improving. States bruise has resolved. Also describes some RLE referral into knee and foot, aching and occasional numbness/tingling (posterior). States this has been present since about a couple days after the fall, states he discussed with his PCP but has not yet discussed with ortho provider. Describes his numbness as stable, non worsening.  PERTINENT HISTORY: HTN, pericarditis, R hip  fracture June 2024 Currently wearing zio heart monitor for palpitations PAIN:  Are you having pain: Up to 9/10 Location/description: R hip Best-worst over past week: 5-9/10  - aggravating factors: WB, LE movement, sleeping (particularly on the right side), lower body dressing  - Easing factors: NWB, medication      PRECAUTIONS: R hip fracture, heart monitor  WEIGHT BEARING RESTRICTIONS: WBAT RLE per referral  FALLS:  Has patient fallen in last 6 months? Yes. Number of falls 1 fall, precipitating episode  LIVING ENVIRONMENT: 1 story house, 3STE B rail Has two roommates, does housework/self care typically but receiving assist as needed  OCCUPATION: retired Electronics engineer - enjoys martial arts, very active, Mining engineer houses, work on car  PLOF: Independent  PATIENT GOALS: get back to being active  NEXT MD VISIT: Some time in August, not in EPIC as of 02/01/2023  OBJECTIVE:   DIAGNOSTIC FINDINGS:  CT pelvis 12/30/21 "IMPRESSION: Acute mildly comminuted fracture involving the greater trochanter of the right femur. Subtle asymmetric hypodensity and enlargement of right quadratus femoris muscle suspected to be secondary to soft tissue injury."  PATIENT SURVEYS:  FOTO 37 current, 58 predicted  COGNITION: Overall cognitive status: Within functional limits for tasks assessed     SENSATION: Light touch intact BIL LE although mildly diminished R lateral thigh/calf and medial knee  PALPATION: Tender R lateral hip and posterior aspect of RLE through hamstrings  LOWER EXTREMITY ROM:     Active  Right eval Left/Right 02/01/2023  Hip flexion 85  deg actively with pain, standing using crutches 95/90  Hip extension    Hip internal rotation  3/0  Hip external rotation  27/27  Knee extension    Knee flexion    Hamstrings   40/40  (Blank rows = not tested) (Key: WFL = within functional limits not formally assessed, * = concordant pain, s = stiffness/stretching sensation, NT = not tested)   Comments: pain returns to baseline levels with cessation of movement  LOWER EXTREMITY MMT:    MMT Right eval Left eval  Hip flexion    Hip abduction (modified sitting)    Hip internal rotation    Hip external rotation    Knee flexion    Knee extension    Ankle dorsiflexion     (Blank rows = not tested) (Key: WFL = within functional limits not formally assessed, * = concordant pain, s = stiffness/stretching sensation, NT = not tested)  Comments: deferred given acuity of fracture  LOWER EXTREMITY SPECIAL TESTS:  Deferred given presence of fracture  FUNCTIONAL TESTS:  TUG: 25sec w axillary crutches, step to pattern leading with LLE, minimal WB through RLE   GAIT: Distance walked: within clinic Assistive device utilized: Crutches Level of assistance: Modified independence Comments: variable, most often using 4 pt gait pattern with axillary crutches and diminished WB through RLE. Also uses step to pattern with crutches, minimal WB through RLE   TODAY'S TREATMENT:                                                                                                                              [facility] Adult PT Treatment:                                                DATE: 01/12/23 02/01/2023 Single knee to chest 4 x 20 seconds Supine hamstrings stretch 4 x 20 seconds Gluteal stretch 4 x 20 seconds Figure 4 stretch with 1 leg straight 4 x 20 seconds Figure 4 stretch 4 x 20 seconds Yoga bridge 10 x 5 seconds Side lie clams (lie left with pillows between knees) 2 sets of 10 x 3 seconds   01/25/2023 Single knee to chest 4 x 20 seconds Supine hamstrings stretch 4 x 20 seconds Gluteal stretch 4 x 20 seconds Figure 4 stretch 4 x 20 seconds Yoga bridge 10 x 5 seconds Side lie clams (lie left with pillows between knees) 2 sets of 10 x 3 seconds    Eval: Deferred, eval and education on symptom monitoring/modification, appropriate positioning techniques and strategies to improve  safety/comfort with activity, following up with provider   PATIENT EDUCATION:  Education details: Pt education on PT impairments, prognosis, and POC. Informed consent. Rationale for interventions, safety/comfort with mobility, AD use, following up with provider, close monitoring of symptoms and appropriate action Person educated:  Patient Education method: Explanation, Demonstration, Tactile cues, Verbal cues Education comprehension: verbalized understanding, returned demonstration, verbal cues required, tactile cues required, and needs further education    HOME EXERCISE PROGRAM:   ASSESSMENT:  CLINICAL IMPRESSION: Phillip Gilbert did a good job with his early hip AROM, flexibility and strengthening program.  Phillip Gilbert's follow-up with Hazle Nordmann showed the fracture is stable and still healing and WBAT is still the plan.  Appropriate AROM, NWB and partial WB strength progressions will be made as appropriate.  OBJECTIVE IMPAIRMENTS: Abnormal gait, decreased activity tolerance, decreased balance, decreased endurance, decreased mobility, difficulty walking, decreased ROM, decreased strength, impaired perceived functional ability, impaired sensation, improper body mechanics, postural dysfunction, and pain.   ACTIVITY LIMITATIONS: carrying, lifting, standing, squatting, sleeping, stairs, transfers, bed mobility, and locomotion level  PARTICIPATION LIMITATIONS: meal prep, cleaning, laundry, shopping, community activity, and occupation  PERSONAL FACTORS: Age, Time since onset of injury/illness/exacerbation, and 3+ comorbidities: HTN, fracture, hx pericarditis  are also affecting patient's functional outcome.   REHAB POTENTIAL: Good  CLINICAL DECISION MAKING: Evolving/moderate complexity  EVALUATION COMPLEXITY: Moderate   GOALS: Goals reviewed with patient? No - did review role of PT, PT POC  SHORT TERM GOALS: Target date: 02/09/2023 Pt will demonstrate appropriate understanding and performance  of initially prescribed HEP in order to facilitate improved independence with management of symptoms.  Baseline: Hstarted 01/25/2023 Goal status: On Going 02/01/2023   2. Pt will score greater than or equal to 48 on FOTO in order to demonstrate improved perception of function due to symptoms.  Baseline: 37  Goal status: INITIAL    LONG TERM GOALS: Target date: 03/09/2023 Pt will score 58 or greater on FOTO in order to demonstrate improved perception of function due to symptoms.  Baseline: 37 Goal status: INITIAL  2.  Pt will be able to perform TUG in less than or equal to 13 sec with LRAD in order to indicate reduced risk of falling (cutoff score for fall risk 13.5 sec in community dwelling older adults per [facility] et al, 2000)  Baseline: 25sec with axillary crutches  Goal status: INITIAL    3. Pt will report/demonstrate ability to navigate at least 3 stairs without UE support and grossly WNL mechanics in order to facilitate improved safety/ease with home access.  Baseline: 3STE home per pt report  Goal status: INITIAL  4. Pt will report at least 50% decrease in overall pain levels in past week in order to facilitate improved tolerance to basic ADLs/mobility.   Baseline: 8-10/10  Goal status: INITIAL    5. Pt will demonstrate appropriate performance of final prescribed HEP in order to facilitate improved self-management of symptoms post-discharge.   Baseline: HEP TBD  Goal status: INITIAL    6. Pt will be able to ambulate at least 531ft with LRAD and les than 3 pt increase in resting pain on NPS in order to facilitate improved community navigation.  Baseline: within clinic, mechanics as above, painful with use of axillary crutches bilaterally  Goal status: INITIAL  PLAN:  PT FREQUENCY: 1-2x/week  PT DURATION: 8 weeks  PLANNED INTERVENTIONS: Therapeutic exercises, Therapeutic activity, Neuromuscular re-education, Balance training, Gait training, Patient/Family education, Self  Care, Stair training, Aquatic Therapy, Dry Needling, Cryotherapy, Moist heat, Taping, Manual therapy, and Re-evaluation  PLAN FOR NEXT SESSION: Consider balance activities, bike, side lie hip abduction if ready.  Cherlyn Cushing PT, MPT 02/01/2023 4:02 PM

## 2023-02-09 ENCOUNTER — Ambulatory Visit (INDEPENDENT_AMBULATORY_CARE_PROVIDER_SITE_OTHER): Payer: No Typology Code available for payment source | Admitting: Rehabilitative and Restorative Service Providers"

## 2023-02-09 ENCOUNTER — Encounter: Payer: Self-pay | Admitting: Rehabilitative and Restorative Service Providers"

## 2023-02-09 DIAGNOSIS — M25551 Pain in right hip: Secondary | ICD-10-CM | POA: Diagnosis not present

## 2023-02-09 DIAGNOSIS — R2689 Other abnormalities of gait and mobility: Secondary | ICD-10-CM

## 2023-02-09 DIAGNOSIS — M6281 Muscle weakness (generalized): Secondary | ICD-10-CM

## 2023-02-09 NOTE — Therapy (Signed)
OUTPATIENT PHYSICAL THERAPY LOWER EXTREMITY TREATMENT   Patient Name: Phillip Gilbert MRN: [id number] DOB:[birth year], 67 y.o., male Today's Date: 02/09/2023  END OF SESSION:  PT End of Session - 02/09/23 1517     Visit Number 4    Number of Visits 9    Date for PT Re-Evaluation 03/09/23    Authorization Type self pay    Progress Note Due on Visit 10    PT Start Time 1515    PT Stop Time 1557    PT Time Calculation (min) 42 min    Activity Tolerance Patient tolerated treatment well;No increased pain    Behavior During Therapy WFL for tasks assessed/performed              Past Medical History:  Diagnosis Date   Hypertension    Pericarditis    History reviewed. No pertinent surgical history. There are no problems to display for this patient.   PCP: [facility]  REFERRING PROVIDER: Victorino December  REFERRING DIAG: [phone] (ICD-10-CM) - Closed nondisplaced fracture of greater trochanter of right femur, initial encounter (HCC)  THERAPY DIAG:  Pain in right hip  Other abnormalities of gait and mobility  Muscle weakness (generalized)  Rationale for Evaluation and Treatment: Rehabilitation  ONSET DATE: 12/31/22  SUBJECTIVE:   SUBJECTIVE STATEMENT: Dylanger reports pain remains better controlled with his new pain medication.  He reports consistent HEP compliance.  He reports that he is sore from a lot of movement.  He has also been using a cane rather than bilateral axillary crutches about 10% of the time.   Pt states he tripped over tree roots and fell on his R side. Had immediate pain, began to bruise, went to ED where he was diagnosed with fracture on CT. Reports pretty significant pain consistently since fracture, not improving. States bruise has resolved. Also describes some RLE referral into knee and foot, aching and occasional numbness/tingling (posterior). States this has been present since about a couple days after the fall, states he discussed with  his PCP but has not yet discussed with ortho provider. Describes his numbness as stable, non worsening.  PERTINENT HISTORY: HTN, pericarditis, R hip fracture June 2024 Currently wearing zio heart monitor for palpitations PAIN:  Are you having pain: 3-6/10 over the past 7 days Location/description: R hip Best-worst over past week: 3-6/10  - aggravating factors: WB, LE movement, sleeping (particularly on the right side), lower body dressing  - Easing factors: NWB, medication      PRECAUTIONS: R hip fracture, heart monitor  WEIGHT BEARING RESTRICTIONS: WBAT RLE per referral  FALLS:  Has patient fallen in last 6 months? Yes. Number of falls 1 fall, precipitating episode  LIVING ENVIRONMENT: 1 story house, 3STE B rail Has two roommates, does housework/self care typically but receiving assist as needed  OCCUPATION: retired Electronics engineer - enjoys martial arts, very active, Mining engineer houses, work on car  PLOF: Independent  PATIENT GOALS: get back to being active  NEXT MD VISIT: Some time in August, not in EPIC as of 02/09/2023  OBJECTIVE:   DIAGNOSTIC FINDINGS:  CT pelvis 12/30/21 "IMPRESSION: Acute mildly comminuted fracture involving the greater trochanter of the right femur. Subtle asymmetric hypodensity and enlargement of right quadratus femoris muscle suspected to be secondary to soft tissue injury."  PATIENT SURVEYS:  FOTO 37 current, 58 predicted  COGNITION: Overall cognitive status: Within functional limits for tasks assessed     SENSATION: Light touch intact BIL LE although mildly diminished R lateral thigh/calf  and medial knee  PALPATION: Tender R lateral hip and posterior aspect of RLE through hamstrings  LOWER EXTREMITY ROM:     Active  Right eval Left/Right 02/01/2023  Hip flexion 85 deg actively with pain, standing using crutches 95/90  Hip extension    Hip internal rotation  3/0  Hip external rotation  27/27  Knee extension    Knee flexion    Hamstrings   40/40   (Blank rows = not tested) (Key: WFL = within functional limits not formally assessed, * = concordant pain, s = stiffness/stretching sensation, NT = not tested)  Comments: pain returns to baseline levels with cessation of movement  LOWER EXTREMITY MMT:    MMT Right eval Left eval  Hip flexion    Hip abduction (modified sitting)    Hip internal rotation    Hip external rotation    Knee flexion    Knee extension    Ankle dorsiflexion     (Blank rows = not tested) (Key: WFL = within functional limits not formally assessed, * = concordant pain, s = stiffness/stretching sensation, NT = not tested)  Comments: deferred given acuity of fracture  LOWER EXTREMITY SPECIAL TESTS:  Deferred given presence of fracture  FUNCTIONAL TESTS:  TUG: 25sec w axillary crutches, step to pattern leading with LLE, minimal WB through RLE   GAIT: Distance walked: within clinic Assistive device utilized: Crutches Level of assistance: Modified independence Comments: variable, most often using 4 pt gait pattern with axillary crutches and diminished WB through RLE. Also uses step to pattern with crutches, minimal WB through RLE   TODAY'S TREATMENT:                                                                                                                              [facility] Adult PT Treatment:                                                DATE: 01/12/23 02/09/2023 Single knee to chest 4 x 20 seconds Supine hamstrings stretch 4 x 20 seconds Gluteal stretch 4 x 20 seconds Figure 4 stretch 4 x 20 seconds Yoga bridge 10 x 5 seconds Side lie clams (lie left with pillows between knees) 10 x 3 seconds Side lie hip abduction 10 x 3 seconds (lie left, lift right)  Neuromuscular re-education: Tandem balance 10 x 20 seconds   02/01/2023 Single knee to chest 4 x 20 seconds Supine hamstrings stretch 4 x 20 seconds Gluteal stretch 4 x 20 seconds Figure 4 stretch with 1 leg straight 4 x 20 seconds Figure 4  stretch 4 x 20 seconds Yoga bridge 10 x 5 seconds Side lie clams (lie left with pillows between knees) 2 sets of 10 x 3 seconds   01/25/2023 Single knee to chest 4 x 20 seconds  Supine hamstrings stretch 4 x 20 seconds Gluteal stretch 4 x 20 seconds Figure 4 stretch 4 x 20 seconds Yoga bridge 10 x 5 seconds Side lie clams (lie left with pillows between knees) 2 sets of 10 x 3 seconds    PATIENT EDUCATION:  Education details: Pt education on PT impairments, prognosis, and POC. Informed consent. Rationale for interventions, safety/comfort with mobility, AD use, following up with provider, close monitoring of symptoms and appropriate action Person educated: Patient Education method: Explanation, Demonstration, Tactile cues, Verbal cues Education comprehension: verbalized understanding, returned demonstration, verbal cues required, tactile cues required, and needs further education    HOME EXERCISE PROGRAM: [id number]   ASSESSMENT:  CLINICAL IMPRESSION: Asahel continues to do a good job with his home exercise program compliance.  He reports 2-3 times per day compliance which is excellent.  We were able to progress some hip abductor strengthening and some gentle weightbearing functional activities today to complement his early excellent program.  Appropriate AROM, NWB and partial WB strength progressions will be made as appropriate.  OBJECTIVE IMPAIRMENTS: Abnormal gait, decreased activity tolerance, decreased balance, decreased endurance, decreased mobility, difficulty walking, decreased ROM, decreased strength, impaired perceived functional ability, impaired sensation, improper body mechanics, postural dysfunction, and pain.   ACTIVITY LIMITATIONS: carrying, lifting, standing, squatting, sleeping, stairs, transfers, bed mobility, and locomotion level  PARTICIPATION LIMITATIONS: meal prep, cleaning, laundry, shopping, community activity, and occupation  PERSONAL FACTORS: Age, Time since  onset of injury/illness/exacerbation, and 3+ comorbidities: HTN, fracture, hx pericarditis  are also affecting patient's functional outcome.   REHAB POTENTIAL: Good  CLINICAL DECISION MAKING: Evolving/moderate complexity  EVALUATION COMPLEXITY: Moderate   GOALS: Goals reviewed with patient? No - did review role of PT, PT POC  SHORT TERM GOALS: Target date: 02/09/2023 Pt will demonstrate appropriate understanding and performance of initially prescribed HEP in order to facilitate improved independence with management of symptoms.  Baseline: Started 01/25/2023 Goal status: Met 02/09/2023  2. Pt will score greater than or equal to 48 on FOTO in order to demonstrate improved perception of function due to symptoms.  Baseline: 37  Goal status: INITIAL    LONG TERM GOALS: Target date: 03/09/2023 Pt will score 58 or greater on FOTO in order to demonstrate improved perception of function due to symptoms.  Baseline: 37 Goal status: INITIAL  2.  Pt will be able to perform TUG in less than or equal to 13 sec with LRAD in order to indicate reduced risk of falling (cutoff score for fall risk 13.5 sec in community dwelling older adults per [facility] et al, 2000)  Baseline: 25sec with axillary crutches  Goal status: INITIAL    3. Pt will report/demonstrate ability to navigate at least 3 stairs without UE support and grossly WNL mechanics in order to facilitate improved safety/ease with home access.  Baseline: 3STE home per pt report  Goal status: On Going 02/09/2023  4. Pt will report at least 50% decrease in overall pain levels in past week in order to facilitate improved tolerance to basic ADLs/mobility.   Baseline: 8-10/10  Goal status: On Going 02/09/2023  5. Pt will demonstrate appropriate performance of final prescribed HEP in order to facilitate improved self-management of symptoms post-discharge.   Baseline: HEP TBD  Goal status: On Going 02/09/2023  6. Pt will be able to ambulate at least  58ft with LRAD and les than 3 pt increase in resting pain on NPS in order to facilitate improved community navigation.  Baseline: within clinic, mechanics as above,  painful with use of axillary crutches bilaterally  Goal status: On Going 02/09/2023  PLAN:  PT FREQUENCY: 1-2x/week  PT DURATION: 8 weeks  PLANNED INTERVENTIONS: Therapeutic exercises, Therapeutic activity, Neuromuscular re-education, Balance training, Gait training, Patient/Family education, Self Care, Stair training, Aquatic Therapy, Dry Needling, Cryotherapy, Moist heat, Taping, Manual therapy, and Re-evaluation  PLAN FOR NEXT SESSION: Progress balance activities, bike, hip abduction strengthening if ready.  Cherlyn Cushing PT, MPT 02/09/2023 4:08 PM

## 2023-02-15 ENCOUNTER — Encounter: Payer: No Typology Code available for payment source | Admitting: Rehabilitative and Restorative Service Providers"

## 2023-02-23 ENCOUNTER — Telehealth: Payer: Self-pay | Admitting: Student

## 2023-02-26 ENCOUNTER — Encounter: Payer: No Typology Code available for payment source | Admitting: Physical Therapy

## 2023-02-28 ENCOUNTER — Ambulatory Visit (HOSPITAL_BASED_OUTPATIENT_CLINIC_OR_DEPARTMENT_OTHER): Payer: No Typology Code available for payment source | Admitting: Student

## 2023-03-02 ENCOUNTER — Ambulatory Visit (HOSPITAL_BASED_OUTPATIENT_CLINIC_OR_DEPARTMENT_OTHER): Payer: No Typology Code available for payment source

## 2023-03-02 ENCOUNTER — Ambulatory Visit (HOSPITAL_BASED_OUTPATIENT_CLINIC_OR_DEPARTMENT_OTHER): Payer: No Typology Code available for payment source | Admitting: Student

## 2023-03-02 ENCOUNTER — Other Ambulatory Visit (HOSPITAL_BASED_OUTPATIENT_CLINIC_OR_DEPARTMENT_OTHER): Payer: Self-pay

## 2023-03-02 ENCOUNTER — Encounter (HOSPITAL_BASED_OUTPATIENT_CLINIC_OR_DEPARTMENT_OTHER): Payer: Self-pay | Admitting: Student

## 2023-03-02 DIAGNOSIS — M25561 Pain in right knee: Secondary | ICD-10-CM

## 2023-03-02 DIAGNOSIS — S72114D Nondisplaced fracture of greater trochanter of right femur, subsequent encounter for closed fracture with routine healing: Secondary | ICD-10-CM

## 2023-03-02 MED ORDER — TRAMADOL HCL 50 MG PO TABS
50.0000 mg | ORAL_TABLET | Freq: Four times a day (QID) | ORAL | 0 refills | Status: AC | PRN
  Filled 2023-03-02: qty 20, 5d supply, fill #0

## 2023-03-03 NOTE — Progress Notes (Signed)
Chief Complaint: Right hip pain     History of Present Illness:   03/03/23: Phillip Gilbert is 8 weeks status post right greater trochanter fracture due to a fall.  He has been going to physical therapy which she reports is helping to improve his function and range of motion, however his pain levels are still high.  He rates his pain at an 8/10 and is at its worst when he is standing up from a seated position.  This is mainly over the lateral hip but does sometimes radiate down the leg.  He has typically been managing this by taking a half of a tramadol 50 mg at a time.  Has noticed that his right leg is beginning to look little smaller.  He reports that he is an avid bicyclist and prior to his injury he was biking 100 miles per week.   01/30/23: Patient is 4 weeks status post fracture of the right greater trochanter.  Overall he reports some improvement in symptoms, however his pain remains severe.  He has only been taking naproxen which has helped some.  He is using crutches and reports placing 40 to 50% of normal weight through his right leg.  Does still report lateral sided hip pain radiating down the front leg to the knee.  Began outpatient rehab late last week with PT and states that this has been going well.  Still has been unable to pick up tramadol or previous Percocet prescriptions due to financial concerns.   Surgical History:   None  PMH/PSH/Family History/Social History/Meds/Allergies:    Past Medical History:  Diagnosis Date   Hypertension    Pericarditis    History reviewed. No pertinent surgical history. Social History   Socioeconomic History   Marital status: Single    Spouse name: Not on file   Number of children: Not on file   Years of education: Not on file   Highest education level: Not on file  Occupational History   Not on file  Tobacco Use   Smoking status: Every Day    Current packs/day: 0.15    Types: Cigarettes   Smokeless  tobacco: Never  Substance and Sexual Activity   Alcohol use: Yes    Comment: Beer Occ   Drug use: Not Currently    Types: Marijuana, Cocaine   Sexual activity: Not on file  Other Topics Concern   Not on file  Social History Narrative   Not on file   Social Determinants of Health   Financial Resource Strain: Not on file  Food Insecurity: Not on file  Transportation Needs: Not on file  Physical Activity: Not on file  Stress: Not on file  Social Connections: Not on file   History reviewed. No pertinent family history. Allergies  Allergen Reactions   Aspirin Other (See Comments)    "upsets my stomach."   Penicillins Nausea And Vomiting   Current Outpatient Medications  Medication Sig Dispense Refill   traMADol (ULTRAM) 50 MG tablet Take 1 tablet (50 mg total) by mouth every 6 (six) hours as needed for up to 5 days for severe pain. 20 tablet 0   celecoxib (CELEBREX) 200 MG capsule Take 1 capsule (200 mg total) by mouth 2 (two) times daily. 20 capsule 0   gabapentin (NEURONTIN) 300 MG capsule Take 300 mg by  mouth 3 (three) times daily.     omeprazole (PRILOSEC) 40 MG capsule Take 40 mg by mouth daily.     oxyCODONE-acetaminophen (PERCOCET/ROXICET) 5-325 MG tablet Take 1-2 tablets by mouth every 6 (six) hours as needed for severe pain. 15 tablet 0   pantoprazole (PROTONIX) 40 MG tablet Take 1 tablet (40 mg total) by mouth daily. 30 tablet 0   No current facility-administered medications for this visit.   No results found.  Review of Systems:   A ROS was performed including pertinent positives and negatives as documented in the HPI.  Physical Exam :   Constitutional: NAD and appears stated age Neurological: Alert and oriented Psych: Appropriate affect and cooperative There were no vitals taken for this visit.   Comprehensive Musculoskeletal Exam:    Inspection Right Left  Skin No atrophy or gross abnormalities appreciated No atrophy or gross abnormalities appreciated   Palpation    Tenderness Greater trochanter None  Crepitus None None  Range of Motion    Flexion (passive) 100 120  Extension 30 30  IR 20 30  ER 20 20  Strength    Flexion  5/5 5/5  Extension 5/5 5/5  Special Tests    FABER Negative Negative  FADIR Negative Negative  ER Lag/Capsular Insufficiency Negative Negative  Instability Negative Negative  Sacroiliac pain Negative  Negative   Instability    Generalized Laxity No No  Neurologic    sciatic, femoral, obturator nerves intact to light sensation  Vascular/Lymphatic    DP pulse 2+ 2+  Lumbar Exam    Patient has symmetric lumbar range of motion with negative pain referral to hip     Imaging:   Xray (right hip 4 views): Minimal displacement of right greater trochanter fracture, largely unchanged from prior radiographs.   I personally reviewed and interpreted the radiographs.   Assessment:   67 y.o. male now 8 weeks status post nondisplaced fracture of the right greater trochanter.  He has been improving with function and range of motion with physical therapy, however pain continues to his main concern.  Fracture on today's x-rays looks essentially unchanged from 4 weeks prior.  At this point I would like him to continue as tolerated with physical therapy for strengthening and range of motion.  I will refill his tramadol prescription today.  I would like for him to follow back up in 3 weeks with Dr. Steward Drone for assessment.  Plan :    -Continue with physical therapy for strengthening and range of motion -Return to clinic in 3 weeks for reassessment with Dr. Steward Drone    I personally saw and evaluated the patient, and participated in the management and treatment plan.  Hazle Nordmann, PA-C Orthopedics  This document was dictated using Conservation officer, historic buildings. A reasonable attempt at proof reading has been made to minimize errors.

## 2023-03-07 ENCOUNTER — Telehealth: Payer: Self-pay | Admitting: Student

## 2023-03-07 NOTE — Telephone Encounter (Signed)
Mailed to [place], records faxed

## 2023-03-07 NOTE — Telephone Encounter (Signed)
Please copy all imaging to CD. I have a request from the [place]. Please let me know when ready. Thank you!

## 2023-03-12 ENCOUNTER — Ambulatory Visit (INDEPENDENT_AMBULATORY_CARE_PROVIDER_SITE_OTHER): Payer: No Typology Code available for payment source | Admitting: Physical Therapy

## 2023-03-12 ENCOUNTER — Encounter: Payer: Self-pay | Admitting: Physical Therapy

## 2023-03-12 DIAGNOSIS — M25551 Pain in right hip: Secondary | ICD-10-CM

## 2023-03-12 DIAGNOSIS — R2689 Other abnormalities of gait and mobility: Secondary | ICD-10-CM

## 2023-03-12 DIAGNOSIS — M6281 Muscle weakness (generalized): Secondary | ICD-10-CM

## 2023-03-12 NOTE — Therapy (Signed)
OUTPATIENT PHYSICAL THERAPY LOWER EXTREMITY TREATMENT & Recertification / Progress Note   Patient Name: Phillip Gilbert MRN: [id number] DOB:[birth year], 67 y.o., male Today's Date: 03/12/2023  END OF SESSION:  PT End of Session - 03/12/23 1510     Visit Number 5    Number of Visits 15    Date for PT Re-Evaluation 05/04/23    [facility]    Authorization - Visit Number 5    Authorization - Number of Visits 15    Progress Note Due on Visit 15    PT Start Time 1510    PT Stop Time 1553    PT Time Calculation (min) 43 min    Activity Tolerance Patient tolerated treatment well;No increased pain    Behavior During Therapy WFL for tasks assessed/performed               Past Medical History:  Diagnosis Date   Hypertension    Pericarditis    History reviewed. No pertinent surgical history. There are no problems to display for this patient.   PCP: [facility]  REFERRING PROVIDER: Victorino December  REFERRING DIAG: [phone] (ICD-10-CM) - Closed nondisplaced fracture of greater trochanter of right femur, initial encounter (HCC)  THERAPY DIAG:  Pain in right hip  Other abnormalities of gait and mobility  Muscle weakness (generalized)  Rationale for Evaluation and Treatment: Rehabilitation  ONSET DATE: 12/31/22  SUBJECTIVE:   SUBJECTIVE STATEMENT: He has not been feeling well with his PTSD and he had 2 deaths in family. This is why he has not been to PT in a month.  He uses cane for all community activities and nothing in home some times. Denies falls. His PFS was riding bike over 100 miles per week. His leg spike when laying on left side in bed and right leg drops across. He feels he needs more PT to fully recover from hip fracture issues.   EVAL:   Pt states he tripped over tree roots and fell on his R side. Had immediate pain, began to bruise, went to ED where he was diagnosed with fracture on CT. Reports pretty significant pain  consistently since fracture, not improving. States bruise has resolved. Also describes some RLE referral into knee and foot, aching and occasional numbness/tingling (posterior). States this has been present since about a couple days after the fall, states he discussed with his PCP but has not yet discussed with ortho provider. Describes his numbness as stable, non worsening.  PERTINENT HISTORY: HTN, pericarditis, R hip fracture June 2024 Currently wearing zio heart monitor for palpitations PAIN:  Are you having pain: today 6/10 and over last week lowest 4/10 and highest 10/10 Location/description: R hip Best-worst over past week: 3-6/10  - aggravating factors: WB, LE movement, sleeping (particularly on the right side), lower body dressing  - Easing factors: NWB, medication      PRECAUTIONS: R hip fracture, heart monitor  WEIGHT BEARING RESTRICTIONS: WBAT RLE per referral  FALLS:  Has patient fallen in last 6 months? Yes. Number of falls 1 fall, precipitating episode  LIVING ENVIRONMENT: 1 story house, 3STE B rail Has two roommates, does housework/self care typically but receiving assist as needed  OCCUPATION: retired Electronics engineer - enjoys martial arts, very active, Mining engineer houses, work on car  PLOF: Independent  PATIENT GOALS: get back to being active  NEXT MD VISIT: Some time in August, not in EPIC as of 02/09/2023  OBJECTIVE:   DIAGNOSTIC FINDINGS:  CT pelvis 12/30/21 "  IMPRESSION: Acute mildly comminuted fracture involving the greater trochanter of the right femur. Subtle asymmetric hypodensity and enlargement of right quadratus femoris muscle suspected to be secondary to soft tissue injury."  PATIENT SURVEYS:  03/12/2023: visit 5 FOTO 42% FOTO 37 current, 58 predicted  COGNITION: Overall cognitive status: Within functional limits for tasks assessed     SENSATION: Light touch intact BIL LE although mildly diminished R lateral thigh/calf and medial knee  PALPATION: Tender R  lateral hip and posterior aspect of RLE through hamstrings  LOWER EXTREMITY ROM:     Active  Right eval Left/Right 02/01/2023 Right 03/12/2023  Hip flexion 85 deg actively with pain, standing using crutches 95/90 supine A: 92* P: 101* painful end feel  Hip extension   standing A: 8*  Hip internal rotation  3/0 Seated A: 31*  Hip external rotation  27/27 Seated A: 41*  Knee extension     Knee flexion     Hamstrings   40/40   (Blank rows = not tested) (Key: WFL = within functional limits not formally assessed, * = concordant pain, s = stiffness/stretching sensation, NT = not tested)  Comments: pain returns to baseline levels with cessation of movement  LOWER EXTREMITY MMT:    MMT Right 03/12/23  Hip flexion 4/5  Hip extension 4-/5  Hip abduction 4-/5  Hip internal rotation   Hip external rotation   Knee flexion   Knee extension   Ankle dorsiflexion    (Blank rows = not tested) (Key: WFL = within functional limits not formally assessed, * = concordant pain, s = stiffness/stretching sensation, NT = not tested)  Comments: deferred given acuity of fracture  LOWER EXTREMITY SPECIAL TESTS:  Eval: Deferred given presence of fracture  FUNCTIONAL TESTS:  03/12/2023: SLS LLE >30sec;  RLE 2 sec first attempt & 19 sec 2nd attempt  5X sit to stand: 13.66sec  TUG: 11.62sec  Eval: TUG: 25sec w axillary crutches, step to pattern leading with LLE, minimal WB through RLE   GAIT: 03/12/2023: gait velocity comfortable pace 3.27 ft/sec & fast pace 4.07 ft/sec Pt amb 300' with cane with slight antalgic gait decreased RLE stance duration.  He neg stairs with single rail step-to pattern safely.   Eval: Distance walked: within clinic Assistive device utilized: Crutches Level of assistance: Modified independence Comments: variable, most often using 4 pt gait pattern with axillary crutches and diminished WB through RLE. Also uses step to pattern with crutches, minimal WB through RLE   TODAY'S  TREATMENT:                                                                                                                              [facility] Adult PT Treatment:  DATE: 01/12/23 03/12/2023: Therapeutic Exercise Recumbent bike level 3 for 9 min.  Pt reports pain increases with bike but wants to get back to cycling. Standing green theraband kicks BLEs abd, ext, add & flex 10 reps ea with cane support.   See objective data for FOTO, ROM, strength, gait and functional activities.  Gait Training:  Stairs single rail alternating pattern with supervision and verbal cues on technique.    02/09/2023 Single knee to chest 4 x 20 seconds Supine hamstrings stretch 4 x 20 seconds Gluteal stretch 4 x 20 seconds Figure 4 stretch 4 x 20 seconds Yoga bridge 10 x 5 seconds Side lie clams (lie left with pillows between knees) 10 x 3 seconds Side lie hip abduction 10 x 3 seconds (lie left, lift right)  Neuromuscular re-education: Tandem balance 10 x 20 seconds   02/01/2023 Single knee to chest 4 x 20 seconds Supine hamstrings stretch 4 x 20 seconds Gluteal stretch 4 x 20 seconds Figure 4 stretch with 1 leg straight 4 x 20 seconds Figure 4 stretch 4 x 20 seconds Yoga bridge 10 x 5 seconds Side lie clams (lie left with pillows between knees) 2 sets of 10 x 3 seconds   01/25/2023 Single knee to chest 4 x 20 seconds Supine hamstrings stretch 4 x 20 seconds Gluteal stretch 4 x 20 seconds Figure 4 stretch 4 x 20 seconds Yoga bridge 10 x 5 seconds Side lie clams (lie left with pillows between knees) 2 sets of 10 x 3 seconds    PATIENT EDUCATION:  Education details: Pt education on PT impairments, prognosis, and POC. Informed consent. Rationale for interventions, safety/comfort with mobility, AD use, following up with provider, close monitoring of symptoms and appropriate action Person educated: Patient Education method: Explanation, Demonstration, Tactile cues,  Verbal cues Education comprehension: verbalized understanding, returned demonstration, verbal cues required, tactile cues required, and needs further education    HOME EXERCISE PROGRAM: [id number]   ASSESSMENT:  CLINICAL IMPRESSION: Hasheem has made excellent progress with his mobility but continues to be limited functionally.  He is significantly limited by pain with only a slight improvement in his FOTO score.  His range and strength have improved but continued to be below normal limits.  Patient needs further physical therapy to strengthen his hips to a functional level and work on reducing pain with activities.  OBJECTIVE IMPAIRMENTS: Abnormal gait, decreased activity tolerance, decreased balance, decreased endurance, decreased mobility, difficulty walking, decreased ROM, decreased strength, impaired perceived functional ability, impaired sensation, improper body mechanics, postural dysfunction, and pain.   ACTIVITY LIMITATIONS: carrying, lifting, standing, squatting, sleeping, stairs, transfers, bed mobility, and locomotion level  PARTICIPATION LIMITATIONS: meal prep, cleaning, laundry, shopping, community activity, and occupation  PERSONAL FACTORS: Age, Time since onset of injury/illness/exacerbation, and 3+ comorbidities: HTN, fracture, hx pericarditis  are also affecting patient's functional outcome.   REHAB POTENTIAL: Good  CLINICAL DECISION MAKING: Evolving/moderate complexity  EVALUATION COMPLEXITY: Moderate   GOALS: Goals reviewed with patient? No - did review role of PT, PT POC  SHORT TERM GOALS: Target date: 02/09/2023 Pt will demonstrate appropriate understanding and performance of initially prescribed HEP in order to facilitate improved independence with management of symptoms.  Baseline: Started 01/25/2023 Goal status: Met 02/09/2023  2. Pt will score greater than or equal to 48 on FOTO in order to demonstrate improved perception of function due to symptoms.  Baseline:  37  Goal status: Not met but progressing 03/12/2023  LONG TERM GOALS: Target date: 05/04/2023 Pt will score  58 or greater on FOTO in order to demonstrate improved perception of function due to symptoms.  Baseline: See objective data Goal status:  Not met but progressing 03/12/2023  2.  Pt will be able to perform TUG in less than or equal to 13 sec with LRAD in order to indicate reduced risk of falling (cutoff score for fall risk 13.5 sec in community dwelling older adults per [facility] et al, 2000)  Baseline: See objective data  Goal status: Met 03/12/2023  3. Pt will report/demonstrate ability to navigate at least 3 stairs without UE support and grossly WNL mechanics in order to facilitate improved safety/ease with home access.  Baseline: See objective data  Goal status: Not met but progressing 03/12/2023  4. Pt will report at least 50% decrease in overall pain levels in past week in order to facilitate improved tolerance to basic ADLs/mobility.   Baseline: See subjective data  Goal status: Not met but progressing 03/12/2023  5. Pt will demonstrate appropriate performance of final prescribed HEP in order to facilitate improved self-management of symptoms post-discharge.   Baseline: HEP TBD  Goal status: On Going 03/12/2023  6. Pt will be able to ambulate at least 566ft with LRAD and les than 3 pt increase in resting pain on NPS in order to facilitate improved community navigation.  Baseline: See objective data  Goal status: Not met but progressing 03/12/2023  PLAN:  PT FREQUENCY: 1-2x/week  PT DURATION: 8 weeks for 15 visits total  PLANNED INTERVENTIONS: Therapeutic exercises, Therapeutic activity, Neuromuscular re-education, Balance training, Gait training, Patient/Family education, Self Care, Stair training, Aquatic Therapy, Dry Needling, Cryotherapy, Moist heat, Taping, Manual therapy, and Re-evaluation  PLAN FOR NEXT SESSION: Add standing Thera-Band kicks to HEP, bike, functional  strengthening exercises.   Vladimir Faster, PT, DPT 03/12/2023, 5:26 PM

## 2023-03-14 ENCOUNTER — Telehealth: Payer: Self-pay | Admitting: Rehabilitative and Restorative Service Providers"

## 2023-03-14 ENCOUNTER — Encounter: Payer: Self-pay | Admitting: Rehabilitative and Restorative Service Providers"

## 2023-03-14 ENCOUNTER — Ambulatory Visit (INDEPENDENT_AMBULATORY_CARE_PROVIDER_SITE_OTHER): Payer: No Typology Code available for payment source | Admitting: Rehabilitative and Restorative Service Providers"

## 2023-03-14 DIAGNOSIS — M6281 Muscle weakness (generalized): Secondary | ICD-10-CM

## 2023-03-14 DIAGNOSIS — M25551 Pain in right hip: Secondary | ICD-10-CM

## 2023-03-14 DIAGNOSIS — R2689 Other abnormalities of gait and mobility: Secondary | ICD-10-CM | POA: Diagnosis not present

## 2023-03-14 NOTE — Therapy (Signed)
OUTPATIENT PHYSICAL THERAPY TREATMENT   Patient Name: Phillip Gilbert MRN: [id number] DOB:October 01, 1955, 67 y.o., male Today's Date: 03/14/2023  END OF SESSION:  PT End of Session - 03/14/23 1425     Visit Number 6    Number of Visits 15    Date for PT Re-Evaluation 05/04/23    [facility]    Authorization - Visit Number 6    Authorization - Number of Visits 15    Progress Note Due on Visit 15    PT Start Time 1427    PT Stop Time 1506    PT Time Calculation (min) 39 min    Activity Tolerance Patient tolerated treatment well    Behavior During Therapy WFL for tasks assessed/performed                Past Medical History:  Diagnosis Date   Hypertension    Pericarditis    History reviewed. No pertinent surgical history. There are no problems to display for this patient.   PCP: [facility]  REFERRING PROVIDER: Victorino December  REFERRING DIAG: [phone] (ICD-10-CM) - Closed nondisplaced fracture of greater trochanter of right femur, initial encounter (HCC)  THERAPY DIAG:  Pain in right hip  Other abnormalities of gait and mobility  Muscle weakness (generalized)  Rationale for Evaluation and Treatment: Rehabilitation  ONSET DATE: 12/31/22  SUBJECTIVE:   SUBJECTIVE STATEMENT: Pt indicated having "a little pain" upon arrival today, reported at 6.5/10.  Reported he hasn't been on the actual bike since he got hurt.    PERTINENT HISTORY: HTN, pericarditis, R hip fracture June 2024 Currently wearing zio heart monitor for palpitations  PAIN:  Are you having pain: 5.5/10 Location/description: R hip Best-worst over past week: 3-6/10  - aggravating factors: WB, LE movement, sleeping (particularly on the right side), lower body dressing  - Easing factors: NWB, medication      PRECAUTIONS: Rt hip fracture, heart monitor  WEIGHT BEARING RESTRICTIONS: WBAT RLE per referral  FALLS:  Has patient fallen in last 6 months? Yes.  Number of falls 1 fall, precipitating episode  LIVING ENVIRONMENT: 1 story house, 3STE B rail Has two roommates, does housework/self care typically but receiving assist as needed  OCCUPATION: retired Electronics engineer - enjoys martial arts, very active, Mining engineer houses, work on car  PLOF: Independent  PATIENT GOALS: get back to being active  NEXT MD VISIT: Some time in August, not in EPIC as of 02/09/2023  OBJECTIVE:   DIAGNOSTIC FINDINGS:  CT pelvis 12/30/21 "IMPRESSION: Acute mildly comminuted fracture involving the greater trochanter of the right femur. Subtle asymmetric hypodensity and enlargement of right quadratus femoris muscle suspected to be secondary to soft tissue injury."  PATIENT SURVEYS:  03/12/2023: visit 5 FOTO 42%  Eval: FOTO 37 current, 58 predicted  COGNITION: eval Overall cognitive status: Within functional limits for tasks assessed     SENSATION: eval Light touch intact BIL LE although mildly diminished R lateral thigh/calf and medial knee  PALPATION: eval Tender R lateral hip and posterior aspect of RLE through hamstrings  LOWER EXTREMITY ROM:     Active  Right eval Left/Right 02/01/2023 Right 03/12/2023  Hip flexion 85 deg actively with pain, standing using crutches 95/90 supine A: 92* P: 101* painful end feel  Hip extension   standing A: 8*  Hip internal rotation  3/0 Seated A: 31*  Hip external rotation  27/27 Seated A: 41*  Knee extension     Knee flexion  Hamstrings   40/40   (Blank rows = not tested) (Key: WFL = within functional limits not formally assessed, * = concordant pain, s = stiffness/stretching sensation, NT = not tested)  Comments: pain returns to baseline levels with cessation of movement  LOWER EXTREMITY MMT:    MMT Right 03/12/23  Hip flexion 4/5  Hip extension 4-/5  Hip abduction 4-/5  Hip internal rotation   Hip external rotation   Knee flexion   Knee extension   Ankle dorsiflexion    (Blank rows = not tested) (Key: WFL  = within functional limits not formally assessed, * = concordant pain, s = stiffness/stretching sensation, NT = not tested)  Comments: deferred given acuity of fracture  LOWER EXTREMITY SPECIAL TESTS:  Eval: Deferred given presence of fracture  FUNCTIONAL TESTS:  03/12/2023: SLS LLE >30sec;  RLE 2 sec first attempt & 19 sec 2nd attempt  5X sit to stand: 13.66sec  TUG: 11.62sec  Eval: TUG: 25sec w axillary crutches, step to pattern leading with LLE, minimal WB through RLE   GAIT: 03/12/2023: gait velocity comfortable pace 3.27 ft/sec & fast pace 4.07 ft/sec Pt amb 300' with cane with slight antalgic gait decreased RLE stance duration.  He neg stairs with single rail step-to pattern safely.   Eval: Distance walked: within clinic Assistive device utilized: Crutches Level of assistance: Modified independence Comments: variable, most often using 4 pt gait pattern with axillary crutches and diminished WB through RLE. Also uses step to pattern with crutches, minimal WB through RLE                   TODAY'S TREATMENT:                                                DATE: 03/12/2023: Therex UBE LE only lvl 4.5 seat 11, 10 mins Leg press Double leg 75 lbs x 15, single leg 2 x 15 43 lbs performed bilaterally Standing green theraband kicks BLEs abd, ext, add & flex 20 reps ea with cane support.  Seated SLR 3 x 10 , performed bilaterally    Additional time spent in cues for activity to ensure good techniques.   TODAY'S TREATMENT:                                                DATE: 03/12/2023: Therapeutic Exercise Recumbent bike level 3 for 9 min.  Pt reports pain increases with bike but wants to get back to cycling. Standing green theraband kicks BLEs abd, ext, add & flex 10 reps ea with cane support.   See objective data for FOTO, ROM, strength, gait and functional activities.  Gait Training:  Stairs single rail alternating pattern with supervision and verbal cues on technique.    TODAY'S  TREATMENT:                                                DATE: 02/09/2023 Single knee to chest 4 x 20 seconds Supine hamstrings stretch 4 x 20 seconds Gluteal stretch 4 x 20 seconds Figure 4 stretch 4 x 20  seconds Yoga bridge 10 x 5 seconds Side lie clams (lie left with pillows between knees) 10 x 3 seconds Side lie hip abduction 10 x 3 seconds (lie left, lift right)  Neuromuscular re-education: Tandem balance 10 x 20 seconds   TODAY'S TREATMENT:                                                DATE: 02/01/2023 Single knee to chest 4 x 20 seconds Supine hamstrings stretch 4 x 20 seconds Gluteal stretch 4 x 20 seconds Figure 4 stretch with 1 leg straight 4 x 20 seconds Figure 4 stretch 4 x 20 seconds Yoga bridge 10 x 5 seconds Side lie clams (lie left with pillows between knees) 2 sets of 10 x 3 seconds   PATIENT EDUCATION:  Education details: Pt education on PT impairments, prognosis, and POC. Informed consent. Rationale for interventions, safety/comfort with mobility, AD use, following up with provider, close monitoring of symptoms and appropriate action Person educated: Patient Education method: Explanation, Demonstration, Tactile cues, Verbal cues Education comprehension: verbalized understanding, returned demonstration, verbal cues required, tactile cues required, and needs further education    HOME EXERCISE PROGRAM: [email or web address]   ASSESSMENT:  CLINICAL IMPRESSION: Pt reported doing some short walking at home without cane but mostly walking c use of cane.   Generally good tolerance to intervention with no vocalized pain increases noted in visit except for WB kicks with WB on Rt leg.     OBJECTIVE IMPAIRMENTS: Abnormal gait, decreased activity tolerance, decreased balance, decreased endurance, decreased mobility, difficulty walking, decreased ROM, decreased strength, impaired perceived functional ability, impaired sensation, improper body mechanics, postural dysfunction, and pain.    ACTIVITY LIMITATIONS: carrying, lifting, standing, squatting, sleeping, stairs, transfers, bed mobility, and locomotion level  PARTICIPATION LIMITATIONS: meal prep, cleaning, laundry, shopping, community activity, and occupation  PERSONAL FACTORS: Age, Time since onset of injury/illness/exacerbation, and 3+ comorbidities: HTN, fracture, hx pericarditis  are also affecting patient's functional outcome.   REHAB POTENTIAL: Good  CLINICAL DECISION MAKING: Evolving/moderate complexity  EVALUATION COMPLEXITY: Moderate   GOALS: Goals reviewed with patient? No - did review role of PT, PT POC  SHORT TERM GOALS: Target date: 02/09/2023 Pt will demonstrate appropriate understanding and performance of initially prescribed HEP in order to facilitate improved independence with management of symptoms.  Baseline: Started 01/25/2023 Goal status: Met 02/09/2023  2. Pt will score greater than or equal to 48 on FOTO in order to demonstrate improved perception of function due to symptoms.  Baseline: 37  Goal status: Not met but progressing 03/12/2023  LONG TERM GOALS: Target date: 05/04/2023 Pt will score 58 or greater on FOTO in order to demonstrate improved perception of function due to symptoms.  Baseline: See objective data Goal status:  Not met but progressing 03/12/2023  2.  Pt will be able to perform TUG in less than or equal to 13 sec with LRAD in order to indicate reduced risk of falling (cutoff score for fall risk 13.5 sec in community dwelling older adults per [facility] et al, 2000)  Baseline: See objective data  Goal status: Met 03/12/2023  3. Pt will report/demonstrate ability to navigate at least 3 stairs without UE support and grossly WNL mechanics in order to facilitate improved safety/ease with home access.  Baseline: See objective data  Goal status: Not met but progressing 03/12/2023  4. Pt  will report at least 50% decrease in overall pain levels in past week in order to facilitate  improved tolerance to basic ADLs/mobility.   Baseline: See subjective data  Goal status: Not met but progressing 03/12/2023  5. Pt will demonstrate appropriate performance of final prescribed HEP in order to facilitate improved self-management of symptoms post-discharge.   Baseline: HEP TBD  Goal status: On Going 03/12/2023  6. Pt will be able to ambulate at least 525ft with LRAD and les than 3 pt increase in resting pain on NPS in order to facilitate improved community navigation.  Baseline: See objective data  Goal status: Not met but progressing 03/12/2023  PLAN:  PT FREQUENCY: 1-2x/week  PT DURATION: 8 weeks for 15 visits total  PLANNED INTERVENTIONS: Therapeutic exercises, Therapeutic activity, Neuromuscular re-education, Balance training, Gait training, Patient/Family education, Self Care, Stair training, Aquatic Therapy, Dry Needling, Cryotherapy, Moist heat, Taping, Manual therapy, and Re-evaluation  PLAN FOR NEXT SESSION: Continue bike activity to build ROM/tolerance.  Progressive strengthening/balance improvements.    Chyrel Masson, PT, DPT, OCS, ATC 03/14/23  3:08 PM

## 2023-03-14 NOTE — Telephone Encounter (Signed)
Will see at 2:30 today

## 2023-03-19 ENCOUNTER — Encounter: Payer: Self-pay | Admitting: Rehabilitative and Restorative Service Providers"

## 2023-03-19 ENCOUNTER — Ambulatory Visit (INDEPENDENT_AMBULATORY_CARE_PROVIDER_SITE_OTHER): Payer: No Typology Code available for payment source | Admitting: Rehabilitative and Restorative Service Providers"

## 2023-03-19 DIAGNOSIS — R2689 Other abnormalities of gait and mobility: Secondary | ICD-10-CM | POA: Diagnosis not present

## 2023-03-19 DIAGNOSIS — M6281 Muscle weakness (generalized): Secondary | ICD-10-CM | POA: Diagnosis not present

## 2023-03-19 DIAGNOSIS — M25551 Pain in right hip: Secondary | ICD-10-CM

## 2023-03-19 NOTE — Therapy (Signed)
OUTPATIENT PHYSICAL THERAPY TREATMENT/PROGRESS NOTE   Patient Name: Phillip Gilbert MRN: [id number] DOB:1955-07-20, 67 y.o., male Today's Date: 03/19/2023  END OF SESSION:  PT End of Session - 03/19/23 1439     Visit Number 7    Number of Visits 15    Date for PT Re-Evaluation 05/04/23    [facility]    Authorization - Visit Number 7    Authorization - Number of Visits 15    Progress Note Due on Visit 15    PT Start Time 1431    PT Stop Time 1521    PT Time Calculation (min) 50 min    Activity Tolerance Patient tolerated treatment well;No increased pain    Behavior During Therapy [facility] for tasks assessed/performed            Progress Note Reporting Period 01/12/2023 to 03/19/2023  See note below for Objective Data and Assessment of Progress/Goals.      Past Medical History:  Diagnosis Date   Hypertension    Pericarditis    History reviewed. No pertinent surgical history. There are no problems to display for this patient.   PCP: [facility]  REFERRING PROVIDER: Victorino December  REFERRING DIAG: [phone] (ICD-10-CM) - Closed nondisplaced fracture of greater trochanter of right femur, initial encounter (HCC)  THERAPY DIAG:  Pain in right hip  Other abnormalities of gait and mobility  Muscle weakness (generalized)  Rationale for Evaluation and Treatment: Rehabilitation  ONSET DATE: 12/31/22  SUBJECTIVE:   SUBJECTIVE STATEMENT: Phillip Gilbert notes the most progress with his right hip AROM and sleep.  Strength and gait quality without the cane will benefit from additional work.     PERTINENT HISTORY: HTN, pericarditis, R hip fracture June 2024 Currently wearing zio heart monitor for palpitations  PAIN:  Are you having pain: 4-6/10 Location/description: R hip Best-worst over past week: 4-6/10  - aggravating factors: Prolonged WB, sleeping (particularly on the right side), lower body dressing  - Easing factors: NWB, stretching     PRECAUTIONS: Rt hip fracture, heart monitor  WEIGHT BEARING RESTRICTIONS: WBAT RLE per referral  FALLS:  Has patient fallen in last 6 months? Yes. Number of falls 1 fall, precipitating episode  LIVING ENVIRONMENT: 1 story house, 3STE B rail Has two roommates, does housework/self care typically but receiving assist as needed  OCCUPATION: retired Electronics engineer - enjoys martial arts, very active, Mining engineer houses, work on car  PLOF: Independent  PATIENT GOALS: get back to being active  NEXT MD VISIT: Some time in August, not in EPIC as of 02/09/2023  OBJECTIVE:   DIAGNOSTIC FINDINGS:  CT pelvis 12/30/21 "IMPRESSION: Acute mildly comminuted fracture involving the greater trochanter of the right femur. Subtle asymmetric hypodensity and enlargement of right quadratus femoris muscle suspected to be secondary to soft tissue injury."  PATIENT SURVEYS:  03/19/2023 FOTO 41  03/12/2023: visit 5 FOTO 42%  Eval: FOTO 37 current, 58 predicted  COGNITION: eval Overall cognitive status: Within functional limits for tasks assessed     SENSATION: eval Light touch intact BIL LE although mildly diminished R lateral thigh/calf and medial knee  PALPATION: eval Tender R lateral hip and posterior aspect of RLE through hamstrings  LOWER EXTREMITY ROM:     Active  Right eval Left/Right 02/01/2023 Right 03/12/2023 Left/Right 03/19/2023  Hip flexion 85 deg actively with pain, standing using crutches 95/90 supine A: 92* P: 101* painful end feel 90/95  Hip extension   standing A: 8*   Hip internal  rotation  3/0 Seated A: 31* 3/7  Hip external rotation  27/27 Seated A: 41* 36/35  Knee extension      Knee flexion      Hamstrings   40/40  60/60  (Blank rows = not tested) (Key: WFL = within functional limits not formally assessed, * = concordant pain, s = stiffness/stretching sensation, NT = not tested)  Comments: pain returns to baseline levels with cessation of movement  LOWER EXTREMITY MMT:     MMT Right 03/12/23  Hip flexion 4/5  Hip extension 4-/5  Hip abduction 4-/5  Hip internal rotation   Hip external rotation   Knee flexion   Knee extension   Ankle dorsiflexion    (Blank rows = not tested) (Key: WFL = within functional limits not formally assessed, * = concordant pain, s = stiffness/stretching sensation, NT = not tested)  Comments: deferred given acuity of fracture  LOWER EXTREMITY SPECIAL TESTS:  Eval: Deferred given presence of fracture  FUNCTIONAL TESTS:  03/12/2023: SLS LLE >30sec;  RLE 2 sec first attempt & 19 sec 2nd attempt  5X sit to stand: 13.66sec  TUG: 11.62sec  Eval: TUG: 25sec w axillary crutches, step to pattern leading with LLE, minimal WB through RLE   GAIT: 03/12/2023: gait velocity comfortable pace 3.27 ft/sec & fast pace 4.07 ft/sec Pt amb 300' with cane with slight antalgic gait decreased RLE stance duration.  He neg stairs with single rail step-to pattern safely.   Eval: Distance walked: within clinic Assistive device utilized: Crutches Level of assistance: Modified independence Comments: variable, most often using 4 pt gait pattern with axillary crutches and diminished WB through RLE. Also uses step to pattern with crutches, minimal WB through RLE                   TODAY'S TREATMENT:                                                DATE: 03/19/2023: Therex Single knee to chest 3 x 20 seconds Supine hamstrings stretch 2 x 20 seconds Gluteal stretch 3 x 20 seconds Figure 4 stretch 3 x 20 seconds -Yoga bridge 10 x 5 seconds with HEP -Side lie clams (lie left with pillows between knees) 10 x 3 seconds with HEP -Side lie hip abduction 10 x 3 seconds (lie left, lift right) with HEP Hip hike at counter top 10 x 3 seconds  Neuromuscular re-education: Tandem balance: Eyes open; head turning; eyes closed 2 x 20 seconds each  Functional Activities: Double Leg Press 100# 10x slow eccentrics Single Leg Press Right Only 75# 10x slow  eccentrics Step-down off 4 inch step 10x each side Step-up and over 6 inch step, up with right and down with left slow eccentrics   TODAY'S TREATMENT:                                                DATE: 03/12/2023: Therex UBE LE only lvl 4.5 seat 11, 10 mins Leg press Double leg 75 lbs x 15, single leg 2 x 15 43 lbs performed bilaterally Standing green theraband kicks BLEs abd, ext, add & flex 20 reps ea with cane support.  Seated SLR 3 x 10 ,  performed bilaterally    Additional time spent in cues for activity to ensure good techniques.    TODAY'S TREATMENT:                                                DATE: 02/09/2023 Single knee to chest 4 x 20 seconds Supine hamstrings stretch 4 x 20 seconds Gluteal stretch 4 x 20 seconds Figure 4 stretch 4 x 20 seconds Yoga bridge 10 x 5 seconds Side lie clams (lie left with pillows between knees) 10 x 3 seconds Side lie hip abduction 10 x 3 seconds (lie left, lift right)  Neuromuscular re-education: Tandem balance 10 x 20 seconds   PATIENT EDUCATION:  Education details: Pt education on PT impairments, prognosis, and POC. Informed consent. Rationale for interventions, safety/comfort with mobility, AD use, following up with provider, close monitoring of symptoms and appropriate action Person educated: Patient Education method: Explanation, Demonstration, Tactile cues, Verbal cues Education comprehension: verbalized understanding, returned demonstration, verbal cues required, tactile cues required, and needs further education    HOME EXERCISE PROGRAM: [email or web address]   ASSESSMENT:  CLINICAL IMPRESSION: Phillip Gilbert is making good subjective, objective and functional progress since starting supervised physical therapy.  As expected, AROM has improved quicker than strength or functional weight-bearing, although all are better than at evaluation.  Phillip Gilbert will benefit from up to another 4-6 weeks of strength, balance and functional weight-bearing and gait  work before possible transfer into independent rehabilitation in 4-6 weeks.  His prognosis remains good to meet long-term goals.  OBJECTIVE IMPAIRMENTS: Abnormal gait, decreased activity tolerance, decreased balance, decreased endurance, decreased mobility, difficulty walking, decreased ROM, decreased strength, impaired perceived functional ability, impaired sensation, improper body mechanics, postural dysfunction, and pain.   ACTIVITY LIMITATIONS: carrying, lifting, standing, squatting, sleeping, stairs, transfers, bed mobility, and locomotion level  PARTICIPATION LIMITATIONS: meal prep, cleaning, laundry, shopping, community activity, and occupation  PERSONAL FACTORS: Age, Time since onset of injury/illness/exacerbation, and 3+ comorbidities: HTN, fracture, hx pericarditis  are also affecting patient's functional outcome.   REHAB POTENTIAL: Good  CLINICAL DECISION MAKING: Evolving/moderate complexity  EVALUATION COMPLEXITY: Moderate   GOALS: Goals reviewed with patient? No - did review role of PT, PT POC  SHORT TERM GOALS: Target date: 02/09/2023 Pt will demonstrate appropriate understanding and performance of initially prescribed HEP in order to facilitate improved independence with management of symptoms.  Baseline: Started 01/25/2023 Goal status: Met 02/09/2023  2. Pt will score greater than or equal to 48 on FOTO in order to demonstrate improved perception of function due to symptoms.  Baseline: 37  Goal status: On Going 03/19/2023  LONG TERM GOALS: Target date: 05/04/2023 Pt will score 58 or greater on FOTO in order to demonstrate improved perception of function due to symptoms.  Baseline: See objective data Goal status:  On Going 03/19/2023  2.  Pt will be able to perform TUG in less than or equal to 13 sec with LRAD in order to indicate reduced risk of falling (cutoff score for fall risk 13.5 sec in community dwelling older adults per [facility] et al, 2000)  Baseline: See  objective data  Goal status: Met 03/12/2023  3. Pt will report/demonstrate ability to navigate at least 3 stairs without UE support and grossly WNL mechanics in order to facilitate improved safety/ease with home access.  Baseline: See objective data  Goal status: Met 03/19/2023  4. Pt will report at least 50% decrease in overall pain levels in past week in order to facilitate improved tolerance to basic ADLs/mobility.   Baseline: See subjective data  Goal status: On Going 03/19/2023  5. Pt will demonstrate appropriate performance of final prescribed HEP in order to facilitate improved self-management of symptoms post-discharge.   Baseline: HEP TBD  Goal status: On Going 03/19/2023  6. Pt will be able to ambulate at least 519ft with LRAD and les than 3 pt increase in resting pain on NPS in order to facilitate improved community navigation.  Baseline: See objective data  Goal status: On Going 03/19/2023  PLAN:  PT FREQUENCY: 1-2x/week  PT DURATION: 4-6 additional weeks for 15 visits total  PLANNED INTERVENTIONS: Therapeutic exercises, Therapeutic activity, Neuromuscular re-education, Balance training, Gait training, Patient/Family education, Self Care, Stair training, Aquatic Therapy, Dry Needling, Cryotherapy, Moist heat, Taping, Manual therapy, and Re-evaluation  PLAN FOR NEXT SESSION: Hip abductors strengthening, balance and weight-bearing function progressions to improve gait quality and endurance without a cane.   Cherlyn Cushing PT, MPT 03/19/23  3:35 PM

## 2023-03-20 ENCOUNTER — Encounter: Payer: Self-pay | Admitting: *Deleted

## 2023-03-20 DIAGNOSIS — I471 Supraventricular tachycardia, unspecified: Secondary | ICD-10-CM | POA: Insufficient documentation

## 2023-03-20 DIAGNOSIS — M2140 Flat foot [pes planus] (acquired), unspecified foot: Secondary | ICD-10-CM | POA: Insufficient documentation

## 2023-03-20 DIAGNOSIS — I10 Essential (primary) hypertension: Secondary | ICD-10-CM | POA: Insufficient documentation

## 2023-03-20 DIAGNOSIS — F1010 Alcohol abuse, uncomplicated: Secondary | ICD-10-CM | POA: Insufficient documentation

## 2023-03-20 DIAGNOSIS — N401 Enlarged prostate with lower urinary tract symptoms: Secondary | ICD-10-CM | POA: Insufficient documentation

## 2023-03-20 DIAGNOSIS — F4312 Post-traumatic stress disorder, chronic: Secondary | ICD-10-CM | POA: Insufficient documentation

## 2023-03-20 DIAGNOSIS — Z720 Tobacco use: Secondary | ICD-10-CM | POA: Insufficient documentation

## 2023-03-20 DIAGNOSIS — F338 Other recurrent depressive disorders: Secondary | ICD-10-CM | POA: Insufficient documentation

## 2023-03-20 DIAGNOSIS — F1210 Cannabis abuse, uncomplicated: Secondary | ICD-10-CM | POA: Insufficient documentation

## 2023-03-20 DIAGNOSIS — R519 Headache, unspecified: Secondary | ICD-10-CM | POA: Insufficient documentation

## 2023-03-20 DIAGNOSIS — F1420 Cocaine dependence, uncomplicated: Secondary | ICD-10-CM | POA: Insufficient documentation

## 2023-03-20 DIAGNOSIS — M2020 Hallux rigidus, unspecified foot: Secondary | ICD-10-CM | POA: Insufficient documentation

## 2023-03-20 DIAGNOSIS — F6081 Narcissistic personality disorder: Secondary | ICD-10-CM | POA: Insufficient documentation

## 2023-03-20 DIAGNOSIS — R45851 Suicidal ideations: Secondary | ICD-10-CM | POA: Insufficient documentation

## 2023-03-20 DIAGNOSIS — Z7189 Other specified counseling: Secondary | ICD-10-CM | POA: Insufficient documentation

## 2023-03-20 DIAGNOSIS — R7303 Prediabetes: Secondary | ICD-10-CM | POA: Insufficient documentation

## 2023-03-20 DIAGNOSIS — F1410 Cocaine abuse, uncomplicated: Secondary | ICD-10-CM | POA: Insufficient documentation

## 2023-03-20 DIAGNOSIS — M1990 Unspecified osteoarthritis, unspecified site: Secondary | ICD-10-CM | POA: Insufficient documentation

## 2023-03-20 DIAGNOSIS — F1411 Cocaine abuse, in remission: Secondary | ICD-10-CM | POA: Insufficient documentation

## 2023-03-20 DIAGNOSIS — F40298 Other specified phobia: Secondary | ICD-10-CM | POA: Insufficient documentation

## 2023-03-20 DIAGNOSIS — N529 Male erectile dysfunction, unspecified: Secondary | ICD-10-CM | POA: Insufficient documentation

## 2023-03-20 DIAGNOSIS — M2010 Hallux valgus (acquired), unspecified foot: Secondary | ICD-10-CM | POA: Insufficient documentation

## 2023-03-21 ENCOUNTER — Ambulatory Visit (INDEPENDENT_AMBULATORY_CARE_PROVIDER_SITE_OTHER): Payer: No Typology Code available for payment source | Admitting: Neurology

## 2023-03-21 ENCOUNTER — Encounter: Payer: Self-pay | Admitting: Neurology

## 2023-03-21 VITALS — BP 135/85 | HR 64 | Ht 70.0 in | Wt 165.2 lb

## 2023-03-21 DIAGNOSIS — R351 Nocturia: Secondary | ICD-10-CM | POA: Diagnosis not present

## 2023-03-21 DIAGNOSIS — G4719 Other hypersomnia: Secondary | ICD-10-CM

## 2023-03-21 DIAGNOSIS — Z9189 Other specified personal risk factors, not elsewhere classified: Secondary | ICD-10-CM | POA: Diagnosis not present

## 2023-03-21 DIAGNOSIS — R519 Headache, unspecified: Secondary | ICD-10-CM

## 2023-03-21 DIAGNOSIS — R0683 Snoring: Secondary | ICD-10-CM

## 2023-03-21 DIAGNOSIS — I471 Supraventricular tachycardia, unspecified: Secondary | ICD-10-CM

## 2023-03-21 NOTE — Progress Notes (Signed)
Subjective:    Patient ID: Phillip Gilbert is a 67 y.o. male.  HPI    Phillip Foley, MD, PhD [facility] [address], [address] [address] [place], [place] [address]  I saw patient, Phillip Gilbert, as a referral from the [place] in [place], [place] for evaluation of his sleep disorder, in particular, concern for underlying obstructive sleep apnea.  The patient is unaccompanied today.  Phillip Gilbert is a 67 year old male with an underlying medical history of hypertension, pericarditis, PSVT, fracture of the right femur, chronic pain, on narcotic pain medication, reflux disease and prior substance use disorder (per chart review and patient report), who reports snoring and excessive daytime somnolence.  His Epworth sleepiness score is 18 out of 24, fatigue severity score is 42 out of 63.  [facility].  He reports a bedtime of around 10 to 11:30 PM, rise time around 3:30 AM.  He usually goes to his recliner at the time and stays in his recliner until 7:15 AM.  His weight has been more or less stable.  He takes oxycodone as needed for pain as well as tramadol.  He is followed by orthopedics for his hip pain on the right.  He smokes about a pack per week.  He is working on smoking cessation.  He drinks alcohol in the form of beer occasionally.  He drinks no liquor.  He has not had any illicit drugs in 4 years or more.  He does not drink caffeine daily.  He has nocturia about 3 times per average night and has had occasional morning headaches.  He also reports a history of migraines.  He lives alone, he is divorced.  His brother had sleep apnea and had a CPAP machine, his brother passed away at the age of 86.  His Past Medical History Is Significant For: Past Medical History:  Diagnosis Date   Hypertension    Pericarditis     His Past Surgical History Is Significant For: History reviewed. No pertinent surgical history.  His Family History Is Significant  For: History reviewed. No pertinent family history.  His Social History Is Significant For: Social History   Socioeconomic History   Marital status: Single    Spouse name: Not on file   Number of children: 4   Years of education: Not on file   Highest education level: Not on file  Occupational History   Not on file  Tobacco Use   Smoking status: Every Day    Current packs/day: 0.15    Types: Cigarettes   Smokeless tobacco: Never  Vaping Use   Vaping status: Never Used  Substance and Sexual Activity   Alcohol use: Yes    Comment: Beer Occ   Drug use: Not Currently    Types: Marijuana, Cocaine   Sexual activity: Not on file  Other Topics Concern   Not on file  Social History Narrative   Right and left handed    Wear glasses    Drinks coffee occ   Drinks soda occ    Social Determinants of Health   Financial Resource Strain: Not on file  Food Insecurity: Not on file  Transportation Needs: Not on file  Physical Activity: Not on file  Stress: Not on file  Social Connections: Not on file    His Allergies Are:  Allergies  Allergen Reactions   Aspirin Other (See Comments)    "upsets my stomach."   Other     Shrimp  Penicillins Nausea And Vomiting   Sildenafil   :   His Current Medications Are:  Outpatient Encounter Medications as of 03/21/2023  Medication Sig   DULOXETINE HCL PO Take 20 mg by mouth daily.   finasteride (PROSCAR) 5 MG tablet Take 5 mg by mouth daily.   gabapentin (NEURONTIN) 300 MG capsule Take 300 mg by mouth 3 (three) times daily.   lisinopril (ZESTRIL) 10 MG tablet Take 10 mg by mouth daily.   metoprolol succinate (TOPROL-XL) 25 MG 24 hr tablet Take 50 mg by mouth daily. TAKE ONE -HALF TABLET BY MOUTH DAILY   omeprazole (PRILOSEC) 20 MG capsule Take 20 mg by mouth daily.   tolterodine (DETROL) 2 MG tablet Take 2 mg by mouth 2 (two) times daily.   celecoxib (CELEBREX) 200 MG capsule Take 1 capsule (200 mg total) by mouth 2 (two) times daily.  (Patient not taking: Reported on 03/21/2023)   omeprazole (PRILOSEC) 40 MG capsule Take 40 mg by mouth daily. (Patient not taking: Reported on 03/21/2023)   oxyCODONE-acetaminophen (PERCOCET/ROXICET) 5-325 MG tablet Take 1-2 tablets by mouth every 6 (six) hours as needed for severe pain. (Patient not taking: Reported on 03/21/2023)   pantoprazole (PROTONIX) 40 MG tablet Take 1 tablet (40 mg total) by mouth daily. (Patient not taking: Reported on 03/21/2023)   No facility-administered encounter medications on file as of 03/21/2023.  :   Review of Systems:  Out of a complete 14 point review of systems, all are reviewed and negative with the exception of these symptoms as listed below:  Review of Systems  Neurological:        Pt here for sleep consult. Pt never had sleep study, pt was told he snores, pt does have headaches, sometimes takes nap during day. Pt reports about 4 hours of sleep at night. States he sleeps better during the day.  ESS:18 FSS:42    Objective:  Neurological Exam  Physical Exam Physical Examination:   Vitals:   03/21/23 1351  BP: 135/85  Pulse: 64    General Examination: The patient is a very pleasant 67 y.o. male in no acute distress. He appears well-developed and well-nourished and adequately groomed.   HEENT: Normocephalic, atraumatic, pupils are equal, round and reactive to light, extraocular tracking is good without limitation to gaze excursion or nystagmus noted. Hearing is grossly intact. Face is symmetric with normal facial animation. Speech is clear with no dysarthria noted. There is no hypophonia. There is no lip, neck/head, jaw or voice tremor. Neck is supple with full range of passive and active motion. There are no carotid bruits on auscultation. Oropharynx exam reveals: moderate mouth dryness, edentulous, moderate airway crowding secondary to small airway entry, Mallampati class II, larger uvula noted.  Neck circumference 15-1/8 inches.  Tongue protrudes  centrally and palate elevates symmetrically.   Chest: Clear to auscultation without wheezing, rhonchi or crackles noted.  Heart: S1+S2+0, regular and normal without murmurs, rubs or gallops noted.   Abdomen: Soft, non-tender and non-distended.  Extremities: There is no pitting edema in the distal lower extremities bilaterally.   Skin: Warm and dry without trophic changes noted.   Musculoskeletal: exam reveals no obvious joint deformities.   Neurologically:  Mental status: The patient is awake, alert and oriented in all 4 spheres. His immediate and remote memory, attention, language skills and fund of knowledge are appropriate. There is no evidence of aphasia, agnosia, apraxia or anomia. Speech is clear with normal prosody and enunciation. Thought process is linear. Mood  is normal and affect is normal.  Cranial nerves II - XII are as described above under HEENT exam.  Motor exam: Normal bulk, strength and tone is noted. There is no obvious action or resting tremor.  Fine motor skills and coordination: grossly intact.  Cerebellar testing: No dysmetria or intention tremor. There is no truncal or gait ataxia.  Sensory exam: intact to light touch in the upper and lower extremities.  Gait, station and balance: He stands easily. No veering to one side is noted. No leaning to one side is noted. Posture is age-appropriate and stance is narrow based. Gait shows normal stride length and normal pace. No problems turning are noted.   Assessment and Plan:  In summary, Phillip Gilbert is a very pleasant 67 y.o.-year old male with an underlying medical history of hypertension, pericarditis, PSVT, fracture of the right femur, chronic pain, on narcotic pain medication, reflux disease and prior substance use disorder (per chart review and patient report), whose history and physical exam are concerning for sleep disordered breathing, particularly obstructive sleep apnea (OSA). A laboratory attended sleep study  is typically considered "gold standard" for evaluation of sleep disordered breathing.   I had a long chat with the patient about my findings and the diagnosis of sleep apnea, particularly OSA, its prognosis and treatment options. We talked about medical/conservative treatments, surgical interventions and non-pharmacological approaches for symptom control. I explained, in particular, the risks and ramifications of untreated moderate to severe OSA, especially with respect to developing cardiovascular disease down the road, including congestive heart failure (CHF), difficult to treat hypertension, cardiac arrhythmias (particularly A-fib), neurovascular complications including TIA, stroke and dementia. Even type 2 diabetes has, in part, been linked to untreated OSA. Symptoms of untreated OSA may include (but may not be limited to) daytime sleepiness, nocturia (i.e. frequent nighttime urination), memory problems, mood irritability and suboptimally controlled or worsening mood disorder such as depression and/or anxiety, lack of energy, lack of motivation, physical discomfort, as well as recurrent headaches, especially morning or nocturnal headaches. We talked about the importance of maintaining a healthy lifestyle and striving for healthy weight.  The importance of complete smoking cessation was also addressed.  In addition, we talked about the importance of striving for and maintaining good sleep hygiene. I recommended a sleep study at this time. I outlined the differences between a laboratory attended sleep study which is considered more comprehensive and accurate over the option of a home sleep test (HST); the latter may lead to underestimation of sleep disordered breathing in some instances and does not help with diagnosing upper airway resistance syndrome and is not accurate enough to diagnose primary central sleep apnea typically. I outlined possible surgical and non-surgical treatment options of OSA, including  the use of a positive airway pressure (PAP) device (i.e. CPAP, AutoPAP/APAP or BiPAP in certain circumstances), a custom-made dental device (aka oral appliance, which would require a referral to a specialist dentist or orthodontist typically, and is generally speaking not considered for patients with full dentures or edentulous state), upper airway surgical options, such as traditional UPPP (which is not considered a first-line treatment) or the Inspire device (hypoglossal nerve stimulator, which would involve a referral for consultation with an ENT surgeon, after careful selection, following inclusion criteria - also not first-line treatment). I explained the PAP treatment option to the patient in detail, as this is generally considered first-line treatment.  The patient indicated that he would be willing to try PAP therapy, if the need arises. I  explained the importance of being compliant with PAP treatment, not only for insurance purposes but primarily to improve patient's symptoms symptoms, and for the patient's long term health benefit, including to reduce His cardiovascular risks longer-term.    We will pick up our discussion about the next steps and treatment options after testing.  We will keep him posted as to the test results by phone call and/or MyChart messaging where possible.  We will plan to follow-up in sleep clinic accordingly as well.  I answered all his questions today and the patient was in agreement.   I encouraged him to call with any interim questions, concerns, problems or updates or email [place] through MyChart.  Generally speaking, sleep test authorizations may take up to 2 weeks, sometimes less, sometimes longer, the patient is encouraged to get in touch with [place] if they do not hear back from the sleep lab staff directly within the next 2 weeks.  Thank you very much for allowing me to participate in the care of this nice patient. If I can be of any further assistance to you please do not  hesitate to call me at [phone].  Sincerely,   Phillip Foley, MD, PhD

## 2023-03-21 NOTE — Patient Instructions (Signed)

## 2023-03-23 ENCOUNTER — Ambulatory Visit (HOSPITAL_BASED_OUTPATIENT_CLINIC_OR_DEPARTMENT_OTHER): Payer: No Typology Code available for payment source

## 2023-03-23 ENCOUNTER — Ambulatory Visit (HOSPITAL_BASED_OUTPATIENT_CLINIC_OR_DEPARTMENT_OTHER): Payer: No Typology Code available for payment source | Admitting: Orthopaedic Surgery

## 2023-03-23 DIAGNOSIS — S72114D Nondisplaced fracture of greater trochanter of right femur, subsequent encounter for closed fracture with routine healing: Secondary | ICD-10-CM | POA: Diagnosis not present

## 2023-03-23 NOTE — Progress Notes (Signed)
Chief Complaint: Right hip pain        History of Present Illness:    03/03/23: Presents today for follow-up of his right hip.  Overall he is doing much better.  He is now using a cane just for longer distances but has minimal pain about the lateral hip.  He does have some tightness.     01/30/23: Patient is 4 weeks status post fracture of the right greater trochanter.  Overall he reports some improvement in symptoms, however his pain remains severe.  He has only been taking naproxen which has helped some.  He is using crutches and reports placing 40 to 50% of normal weight through his right leg.  Does still report lateral sided hip pain radiating down the front leg to the knee.  Began outpatient rehab late last week with PT and states that this has been going well.  Still has been unable to pick up tramadol or previous Percocet prescriptions due to financial concerns.     Surgical History:   None   PMH/PSH/Family History/Social History/Meds/Allergies:         Past Medical History:  Diagnosis Date   Hypertension     Pericarditis          History reviewed. No pertinent surgical history.     Social History         Socioeconomic History   Marital status: Single      Spouse name: Not on file   Number of children: Not on file   Years of education: Not on file   Highest education level: Not on file  Occupational History   Not on file  Tobacco Use   Smoking status: Every Day      Current packs/day: 0.15      Types: Cigarettes   Smokeless tobacco: Never  Substance and Sexual Activity   Alcohol use: Yes      Comment: Beer Occ   Drug use: Not Currently      Types: Marijuana, Cocaine   Sexual activity: Not on file  Other Topics Concern   Not on file  Social History Narrative   Not on file    Social Determinants of Health    Financial Resource Strain: Not on file  Food Insecurity: Not on file  Transportation Needs: Not on file  Physical Activity: Not  on file  Stress: Not on file  Social Connections: Not on file    History reviewed. No pertinent family history.     Allergies       Allergies  Allergen Reactions   Aspirin Other (See Comments)      "upsets my stomach."   Penicillins Nausea And Vomiting            Current Outpatient Medications  Medication Sig Dispense Refill   traMADol (ULTRAM) 50 MG tablet Take 1 tablet (50 mg total) by mouth every 6 (six) hours as needed for up to 5 days for severe pain. 20 tablet 0   celecoxib (CELEBREX) 200 MG capsule Take 1 capsule (200 mg total) by mouth 2 (two) times daily. 20 capsule 0   gabapentin (NEURONTIN) 300 MG capsule Take 300 mg by mouth 3 (three) times daily.       omeprazole (PRILOSEC) 40 MG capsule Take 40 mg by mouth daily.       oxyCODONE-acetaminophen (PERCOCET/ROXICET) 5-325 MG tablet Take 1-2 tablets by mouth every 6 (six) hours as needed for severe pain. 15 tablet 0   pantoprazole (  PROTONIX) 40 MG tablet Take 1 tablet (40 mg total) by mouth daily. 30 tablet 0      No current facility-administered medications for this visit.      Imaging Results (Last 48 hours)  No results found.     Review of Systems:   A ROS was performed including pertinent positives and negatives as documented in the HPI.   Physical Exam :   Constitutional: NAD and appears stated age Neurological: Alert and oriented Psych: Appropriate affect and cooperative There were no vitals taken for this visit.    Comprehensive Musculoskeletal Exam:     Inspection Right Left  Skin No atrophy or gross abnormalities appreciated No atrophy or gross abnormalities appreciated  Palpation      Tenderness Greater trochanter None  Crepitus None None  Range of Motion      Flexion (passive) 100 120  Extension 30 30  IR 20 30  ER 20 20  Strength      Flexion  5/5 5/5  Extension 5/5 5/5  Special Tests      FABER Negative Negative  FADIR Negative Negative  ER Lag/Capsular Insufficiency Negative Negative   Instability Negative Negative  Sacroiliac pain Negative  Negative   Instability      Generalized Laxity No No  Neurologic      sciatic, femoral, obturator nerves intact to light sensation  Vascular/Lymphatic      DP pulse 2+ 2+  Lumbar Exam      Patient has symmetric lumbar range of motion with negative pain referral to hip        Imaging:   Xray (right hip 4 views): Minimal displacement of right greater trochanter fracture with interval healing     I personally reviewed and interpreted the radiographs.     Assessment:   67 y.o. male with a right greater trochanter fracture which shows healing on x-ray today.  At this time he will be activity as tolerated.  I will plan to see him back as needed but Plan :     -Return to clinic as needed       I personally saw and evaluated the patient, and participated in the management and treatment plan.

## 2023-03-28 ENCOUNTER — Encounter: Payer: No Typology Code available for payment source | Admitting: Physical Therapy

## 2023-03-30 ENCOUNTER — Encounter: Payer: No Typology Code available for payment source | Admitting: Rehabilitative and Restorative Service Providers"

## 2023-04-02 ENCOUNTER — Encounter: Payer: Self-pay | Admitting: Physical Therapy

## 2023-04-02 ENCOUNTER — Ambulatory Visit (INDEPENDENT_AMBULATORY_CARE_PROVIDER_SITE_OTHER): Payer: No Typology Code available for payment source | Admitting: Physical Therapy

## 2023-04-02 DIAGNOSIS — R2689 Other abnormalities of gait and mobility: Secondary | ICD-10-CM | POA: Diagnosis not present

## 2023-04-02 DIAGNOSIS — M25551 Pain in right hip: Secondary | ICD-10-CM

## 2023-04-02 DIAGNOSIS — M6281 Muscle weakness (generalized): Secondary | ICD-10-CM

## 2023-04-02 NOTE — Therapy (Signed)
OUTPATIENT PHYSICAL THERAPY TREATMENT   Patient Name: Phillip Gilbert MRN: [id number] DOB:1955/11/04, 67 y.o., male Today's Date: 04/02/2023  END OF SESSION:  PT End of Session - 04/02/23 1332     Visit Number 8    Number of Visits 15    Date for PT Re-Evaluation 05/04/23    [facility]    Authorization - Visit Number 8    Authorization - Number of Visits 15    Progress Note Due on Visit 15    PT Start Time 1332    PT Stop Time 1412    PT Time Calculation (min) 40 min    Activity Tolerance Patient tolerated treatment well;No increased pain    Behavior During Therapy WFL for tasks assessed/performed                Past Medical History:  Diagnosis Date   Hypertension    Pericarditis    History reviewed. No pertinent surgical history. Patient Active Problem List   Diagnosis Date Noted   Alcohol abuse 03/20/2023   Cannabis abuse 03/20/2023   Cocaine abuse, uncomplicated (HCC) 03/20/2023   Cocaine dependence, uncomplicated (HCC) 03/20/2023   Essential hypertension 03/20/2023   Cocaine abuse in remission (HCC) 03/20/2023   Benign prostatic hyperplasia with lower urinary tract symptoms 03/20/2023   Flatfoot 03/20/2023   Hallux rigidus 03/20/2023   Hallux valgus 03/20/2023   Headache 03/20/2023   Male erectile disorder 03/20/2023   Needle phobia 03/20/2023   Chronic posttraumatic stress disorder 03/20/2023   Suicidal ideations 03/20/2023   Other recurrent depressive disorders (HCC) 03/20/2023   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   Supraventricular tachycardia, unspecified 03/20/2023   Tobacco use 03/20/2023   Prediabetes 03/20/2023   Unspecified osteoarthritis, unspecified site 03/20/2023   Other specified counseling 03/20/2023    [facility]  REFERRING PROVIDER: Amador Cunas, PA-C  REFERRING DIAG: [phone] (ICD-10-CM) - Closed nondisplaced fracture of greater trochanter of right femur, initial encounter  (HCC)  THERAPY DIAG:  Pain in right hip  Muscle weakness (generalized)  Other abnormalities of gait and mobility  Rationale for Evaluation and Treatment: Rehabilitation  ONSET DATE: 12/31/22  SUBJECTIVE:   SUBJECTIVE STATEMENT: He saw doctor who recommended weaning off cane and continue PT.  No falls.  He walked without cane for 5-10 minutes and his leg was sore.    PERTINENT HISTORY: HTN, pericarditis, R hip fracture June 2024 Currently wearing zio heart monitor for palpitations  PAIN:  Are you having pain: 4.5 - 6 /10 Location/description: R hip Best-worst over past week: 4-6/10  - aggravating factors: Prolonged WB, sleeping (particularly on the right side), lower body dressing  - Easing factors: NWB, stretching    PRECAUTIONS: Rt hip fracture, heart monitor  WEIGHT BEARING RESTRICTIONS: WBAT RLE per referral  FALLS:  Has patient fallen in last 6 months? Yes. Number of falls 1 fall, precipitating episode  LIVING ENVIRONMENT: 1 story house, 3STE B rail Has two roommates, does housework/self care typically but receiving assist as needed  OCCUPATION: retired Electronics engineer - enjoys martial arts, very active, Mining engineer houses, work on car  PLOF: Independent  PATIENT GOALS: get back to being active  NEXT MD VISIT: Some time in August, not in [facility] as of 02/09/2023  OBJECTIVE:   DIAGNOSTIC FINDINGS:  CT pelvis 12/30/21 "IMPRESSION: Acute mildly comminuted fracture involving the greater trochanter of the right femur. Subtle asymmetric hypodensity and enlargement of right quadratus femoris muscle suspected to be secondary to soft tissue injury."  PATIENT SURVEYS:  03/19/2023 FOTO 41  03/12/2023: visit 5 FOTO 42%  Eval: FOTO 37 current, 58 predicted  COGNITION: eval Overall cognitive status: Within functional limits for tasks assessed     SENSATION: eval Light touch intact BIL LE although mildly diminished R lateral thigh/calf and medial knee  PALPATION: eval Tender R  lateral hip and posterior aspect of RLE through hamstrings  LOWER EXTREMITY ROM:     Active  Right eval Left/Right 02/01/2023 Right 03/12/2023 Left/Right 03/19/2023  Hip flexion 85 deg actively with pain, standing using crutches 95/90 supine A: 92* P: 101* painful end feel 90/95  Hip extension   standing A: 8*   Hip internal rotation  3/0 Seated A: 31* 3/7  Hip external rotation  27/27 Seated A: 41* 36/35  Knee extension      Knee flexion      Hamstrings   40/40  60/60  (Blank rows = not tested) (Key: WFL = within functional limits not formally assessed, * = concordant pain, s = stiffness/stretching sensation, NT = not tested)  Comments: pain returns to baseline levels with cessation of movement  LOWER EXTREMITY MMT:    MMT Right 03/12/23  Hip flexion 4/5  Hip extension 4-/5  Hip abduction 4-/5  Hip internal rotation   Hip external rotation   Knee flexion   Knee extension   Ankle dorsiflexion    (Blank rows = not tested) (Key: WFL = within functional limits not formally assessed, * = concordant pain, s = stiffness/stretching sensation, NT = not tested)  Comments: deferred given acuity of fracture  LOWER EXTREMITY SPECIAL TESTS:  Eval: Deferred given presence of fracture  FUNCTIONAL TESTS:  03/12/2023: SLS LLE >30sec;  RLE 2 sec first attempt & 19 sec 2nd attempt  5X sit to stand: 13.66sec  TUG: 11.62sec  Eval: TUG: 25sec w axillary crutches, step to pattern leading with LLE, minimal WB through RLE   GAIT: 03/12/2023: gait velocity comfortable pace 3.27 ft/sec & fast pace 4.07 ft/sec Pt amb 300' with cane with slight antalgic gait decreased RLE stance duration.  He neg stairs with single rail step-to pattern safely.   Eval: Distance walked: within clinic Assistive device utilized: Crutches Level of assistance: Modified independence Comments: variable, most often using 4 pt gait pattern with axillary crutches and diminished WB through RLE. Also uses step to pattern with  crutches, minimal WB through RLE                   TODAY'S TREATMENT:                                                DATE: 04/02/2023: Therapeutic exercise Recumbent bike seat 6 level 3 for 6 min.  Step up, over & step down on BOSU round side up 10 reps 2 step lead so no UE support required Side step on BOSU round side up with non-stance LE reaching across midline ant with stance knee ext & post with stance knee flexed 10 reps BLEs. . Lift 10# kettle bell 15 reps Standing green theraband kicks BLEs abd, ext, add & flex 10 reps ea without UE support.    Neuromuscular re-education: Standing crossways on foam beam with UE resistance blue theraband single UE alternating and BUEs 10 reps ea row, forward reach and biceps curl / upward reach Tandem on foam beam  3 laps and side stepping on foam beam 5 laps including transitioning floor to / from foam.   Therapeutic Activities: Pt amb 100' with 10# weight RUE and 100' LUE safely Pt neg flight 11 steps with light single rail alternating pattern safely.     TREATMENT:                                                DATE: 03/19/2023: Therex Single knee to chest 3 x 20 seconds Supine hamstrings stretch 2 x 20 seconds Gluteal stretch 3 x 20 seconds Figure 4 stretch 3 x 20 seconds -Yoga bridge 10 x 5 seconds with HEP -Side lie clams (lie left with pillows between knees) 10 x 3 seconds with HEP -Side lie hip abduction 10 x 3 seconds (lie left, lift right) with HEP Hip hike at counter top 10 x 3 seconds  Neuromuscular re-education: Tandem balance: Eyes open; head turning; eyes closed 2 x 20 seconds each  Functional Activities: Double Leg Press 100# 10x slow eccentrics Single Leg Press Right Only 75# 10x slow eccentrics Step-down off 4 inch step 10x each side Step-up and over 6 inch step, up with right and down with left slow eccentrics   TREATMENT:                                                DATE: 03/12/2023: Therex UBE LE only lvl 4.5 seat  11, 10 mins Leg press Double leg 75 lbs x 15, single leg 2 x 15 43 lbs performed bilaterally Standing green theraband kicks BLEs abd, ext, add & flex 20 reps ea with cane support.  Seated SLR 3 x 10 , performed bilaterally    Additional time spent in cues for activity to ensure good techniques.     PATIENT EDUCATION:  Education details: Pt education on PT impairments, prognosis, and POC. Informed consent. Rationale for interventions, safety/comfort with mobility, AD use, following up with provider, close monitoring of symptoms and appropriate action Person educated: Patient Education method: Explanation, Demonstration, Tactile cues, Verbal cues Education comprehension: verbalized understanding, returned demonstration, verbal cues required, tactile cues required, and needs further education    HOME EXERCISE PROGRAM:  Access Code: [email or web address] URL: [email or web address] Date: 03/28/2023 Prepared by: Vladimir Faster  Exercises - Single Knee to Chest Stretch  - 2-3 x daily - 7 x weekly - 1 sets - 4-5 reps - 20 seconds hold - Supine Hamstring Stretch  - 2-3 x daily - 7 x weekly - 1 sets - 4-5 reps - 20 seconds hold - Supine Gluteus Stretch  - 2-3 x daily - 7 x weekly - 1 sets - 4-5 reps - 20 seconds hold - Supine Figure 4 Piriformis Stretch  - 2-3 x daily - 7 x weekly - 1 sets - 4-5 reps - 20 seconds hold - Yoga Bridge  - 2 x daily - 7 x weekly - 1 sets - 10 reps - 5 seconds hold - Clamshell  - 2 x daily - 7 x weekly - 1-2 sets - 10 reps - 3 seconds hold - Sidelying Hip Abduction  - 1 x daily - 7 x weekly - 1 sets - 10 reps - 3 seconds hold - Tandem Stance  -  1 x daily - 7 x weekly - 1 sets - 5 reps - 20 second hold - Standing Hip Hiking  - 3-5 x daily - 7 x weekly - 1 sets - 10 reps - 3 seconds hold   ASSESSMENT:  CLINICAL IMPRESSION: Phillip Gilbert has improved his functional strength and balance.  He was able to perform more activities without increase in LE pain.   OBJECTIVE  IMPAIRMENTS: Abnormal gait, decreased activity tolerance, decreased balance, decreased endurance, decreased mobility, difficulty walking, decreased ROM, decreased strength, impaired perceived functional ability, impaired sensation, improper body mechanics, postural dysfunction, and pain.   ACTIVITY LIMITATIONS: carrying, lifting, standing, squatting, sleeping, stairs, transfers, bed mobility, and locomotion level  PARTICIPATION LIMITATIONS: meal prep, cleaning, laundry, shopping, community activity, and occupation  PERSONAL FACTORS: Age, Time since onset of injury/illness/exacerbation, and 3+ comorbidities: HTN, fracture, hx pericarditis  are also affecting patient's functional outcome.   REHAB POTENTIAL: Good  CLINICAL DECISION MAKING: Evolving/moderate complexity  EVALUATION COMPLEXITY: Moderate   GOALS: Goals reviewed with patient? No - did review role of PT, PT POC  SHORT TERM GOALS: Target date: 02/09/2023 Pt will demonstrate appropriate understanding and performance of initially prescribed HEP in order to facilitate improved independence with management of symptoms.  Baseline: Started 01/25/2023 Goal status: Met 02/09/2023  2. Pt will score greater than or equal to 48 on FOTO in order to demonstrate improved perception of function due to symptoms.  Baseline: 37  Goal status: On Going 03/19/2023  LONG TERM GOALS: Target date: 05/04/2023 Pt will score 58 or greater on FOTO in order to demonstrate improved perception of function due to symptoms.  Baseline: See objective data Goal status:  On Going 03/19/2023  2.  Pt will be able to perform TUG in less than or equal to 13 sec with LRAD in order to indicate reduced risk of falling (cutoff score for fall risk 13.5 sec in community dwelling older adults per [facility] et al, 2000)  Baseline: See objective data  Goal status: Met 03/12/2023  3. Pt will report/demonstrate ability to navigate at least 3 stairs without UE support and grossly  WNL mechanics in order to facilitate improved safety/ease with home access.  Baseline: See objective data  Goal status: Met 03/19/2023  4. Pt will report at least 50% decrease in overall pain levels in past week in order to facilitate improved tolerance to basic ADLs/mobility.   Baseline: See subjective data  Goal status: On Going 03/19/2023  5. Pt will demonstrate appropriate performance of final prescribed HEP in order to facilitate improved self-management of symptoms post-discharge.   Baseline: HEP TBD  Goal status: On Going 03/19/2023  6. Pt will be able to ambulate at least 559ft with LRAD and les than 3 pt increase in resting pain on NPS in order to facilitate improved community navigation.  Baseline: See objective data  Goal status: On Going 03/19/2023  PLAN:  PT FREQUENCY: 1-2x/week  PT DURATION: 4-6 additional weeks for 15 visits total  PLANNED INTERVENTIONS: Therapeutic exercises, Therapeutic activity, Neuromuscular re-education, Balance training, Gait training, Patient/Family education, Self Care, Stair training, Aquatic Therapy, Dry Needling, Cryotherapy, Moist heat, Taping, Manual therapy, and Re-evaluation  PLAN FOR NEXT SESSION: assess LE strength, continue functional strength activities especially Hip abductors strengthening, balance and weight-bearing function progressions to improve gait quality and endurance without a cane.    Vladimir Faster, PT, DPT 04/02/2023, 2:15 PM

## 2023-04-04 ENCOUNTER — Encounter: Payer: No Typology Code available for payment source | Admitting: Rehabilitative and Restorative Service Providers"

## 2023-04-06 ENCOUNTER — Encounter: Payer: Self-pay | Admitting: Rehabilitative and Restorative Service Providers"

## 2023-04-06 ENCOUNTER — Ambulatory Visit (INDEPENDENT_AMBULATORY_CARE_PROVIDER_SITE_OTHER): Payer: No Typology Code available for payment source | Admitting: Rehabilitative and Restorative Service Providers"

## 2023-04-06 DIAGNOSIS — M6281 Muscle weakness (generalized): Secondary | ICD-10-CM

## 2023-04-06 DIAGNOSIS — M25551 Pain in right hip: Secondary | ICD-10-CM | POA: Diagnosis not present

## 2023-04-06 DIAGNOSIS — R2689 Other abnormalities of gait and mobility: Secondary | ICD-10-CM

## 2023-04-06 NOTE — Therapy (Signed)
OUTPATIENT PHYSICAL THERAPY TREATMENT   Patient Name: Phillip Gilbert MRN: [id number] DOB:02-07-1956, 67 y.o., male Today's Date: 04/06/2023  END OF SESSION:  PT End of Session - 04/06/23 1425     Visit Number 9    Number of Visits 15    Date for PT Re-Evaluation 05/04/23    [facility]    Authorization - Visit Number 9    Authorization - Number of Visits 15    Progress Note Due on Visit 15    PT Start Time 1345    PT Stop Time 1419    PT Time Calculation (min) 34 min    Activity Tolerance Patient tolerated treatment well;No increased pain    Behavior During Therapy WFL for tasks assessed/performed              Past Medical History:  Diagnosis Date   Hypertension    Pericarditis    History reviewed. No pertinent surgical history. Patient Active Problem List   Diagnosis Date Noted   Alcohol abuse 03/20/2023   Cannabis abuse 03/20/2023   Cocaine abuse, uncomplicated (HCC) 03/20/2023   Cocaine dependence, uncomplicated (HCC) 03/20/2023   Essential hypertension 03/20/2023   Cocaine abuse in remission (HCC) 03/20/2023   Benign prostatic hyperplasia with lower urinary tract symptoms 03/20/2023   Flatfoot 03/20/2023   Hallux rigidus 03/20/2023   Hallux valgus 03/20/2023   Headache 03/20/2023   Male erectile disorder 03/20/2023   Needle phobia 03/20/2023   Chronic posttraumatic stress disorder 03/20/2023   Suicidal ideations 03/20/2023   Other recurrent depressive disorders (HCC) 03/20/2023   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   Supraventricular tachycardia, unspecified (HCC) 03/20/2023   Tobacco use 03/20/2023   Prediabetes 03/20/2023   Unspecified osteoarthritis, unspecified site 03/20/2023   Other specified counseling 03/20/2023    [facility]  REFERRING PROVIDER: Amador Cunas, PA-C  REFERRING DIAG: [phone] (ICD-10-CM) - Closed nondisplaced fracture of greater trochanter of right femur, initial encounter  (HCC)  THERAPY DIAG:  Pain in right hip  Muscle weakness (generalized)  Other abnormalities of gait and mobility  Rationale for Evaluation and Treatment: Rehabilitation  ONSET DATE: 12/31/22  SUBJECTIVE:   SUBJECTIVE STATEMENT: Phillip Needle notes he only needed 2 tramadol this week, which is a significant improvement from when I last saw him 3 weeks ago.  No cane use in a few weeks.  PERTINENT HISTORY: HTN, pericarditis, R hip fracture June 2024 Currently wearing zio heart monitor for palpitations  PAIN:  Are you having pain: 2 - 6/10 this week Location/description: R hip Best-worst over past week: 2-6/10  - aggravating factors: Prolonged WB, much better with sleeping on the right side and lower body dressing  - Easing factors: Get off his feet, tramadol (only 2 x this week)    PRECAUTIONS: Rt hip fracture, heart monitor  WEIGHT BEARING RESTRICTIONS: WBAT RLE per referral  FALLS:  Has patient fallen in last 6 months? Yes. Number of falls 1 fall, precipitating episode  LIVING ENVIRONMENT: 1 story house, 3STE B rail Has two roommates, does housework/self care typically but receiving assist as needed  OCCUPATION: retired Electronics engineer - enjoys martial arts, very active, Mining engineer houses, work on car  PLOF: Independent  PATIENT GOALS: get back to being active  NEXT MD VISIT: Some time in August, not in [facility] as of 02/09/2023  OBJECTIVE:   DIAGNOSTIC FINDINGS:  CT pelvis 12/30/21 "IMPRESSION: Acute mildly comminuted fracture involving the greater trochanter of the right femur. Subtle asymmetric hypodensity and enlargement  of right quadratus femoris muscle suspected to be secondary to soft tissue injury."  PATIENT SURVEYS:  03/19/2023 FOTO 41  03/12/2023: visit 5 FOTO 42%  Eval: FOTO 37 current, 58 predicted  COGNITION: eval Overall cognitive status: Within functional limits for tasks assessed     SENSATION: eval Light touch intact BIL LE although mildly diminished R lateral  thigh/calf and medial knee  PALPATION: eval Tender R lateral hip and posterior aspect of RLE through hamstrings  LOWER EXTREMITY ROM:     Active  Right eval Left/Right 02/01/2023 Right 03/12/2023 Left/Right 03/19/2023  Hip flexion 85 deg actively with pain, standing using crutches 95/90 supine A: 92* P: 101* painful end feel 90/95  Hip extension   standing A: 8*   Hip internal rotation  3/0 Seated A: 31* 3/7  Hip external rotation  27/27 Seated A: 41* 36/35  Knee extension      Knee flexion      Hamstrings   40/40  60/60  (Blank rows = not tested) (Key: WFL = within functional limits not formally assessed, * = concordant pain, s = stiffness/stretching sensation, NT = not tested)  Comments: pain returns to baseline levels with cessation of movement  LOWER EXTREMITY MMT:    MMT Right 03/12/23 Right 04/06/2023  Hip flexion 4/5 4+/5  Hip extension 4-/5 4+/5  Hip abduction 4-/5 4/5  Hip internal rotation    Hip external rotation    Knee flexion    Knee extension    Ankle dorsiflexion     (Blank rows = not tested) (Key: WFL = within functional limits not formally assessed, * = concordant pain, s = stiffness/stretching sensation, NT = not tested)  Comments: deferred given acuity of fracture  LOWER EXTREMITY SPECIAL TESTS:  Eval: Deferred given presence of fracture  FUNCTIONAL TESTS:  03/12/2023: SLS LLE >30sec;  RLE 2 sec first attempt & 19 sec 2nd attempt  5X sit to stand: 13.66sec  TUG: 11.62sec  Eval: TUG: 25sec w axillary crutches, step to pattern leading with LLE, minimal WB through RLE   GAIT: 03/12/2023: gait velocity comfortable pace 3.27 ft/sec & fast pace 4.07 ft/sec Pt amb 300' with cane with slight antalgic gait decreased RLE stance duration.  He neg stairs with single rail step-to pattern safely.   Eval: Distance walked: within clinic Assistive device utilized: Crutches Level of assistance: Modified independence Comments: variable, most often using 4 pt gait  pattern with axillary crutches and diminished WB through RLE. Also uses step to pattern with crutches, minimal WB through RLE                   TODAY'S TREATMENT:                                                DATE:  04/06/2023 Recumbent bike Seat 6 for 6 minutes Level 6 Bridging 10 x 8 seconds Side lie hip abduction 10 x 3 seconds  Functional Activities: Single Leg Press Right Only 100# 10x slow eccentrics Step-down off 6 inch step 10x right side Step-up and over 6 inch step, up with right and down with left slow eccentrics 10 x   Neuromuscular re-education: Tandem balance: Eyes open; head turning; on foam; eyes closed 1 x 20 seconds each   04/02/2023: Therapeutic exercise Recumbent bike seat 6 level 3 for 6 min.  Step  up, over & step down on BOSU round side up 10 reps 2 step lead so no UE support required Side step on BOSU round side up with non-stance LE reaching across midline ant with stance knee ext & post with stance knee flexed 10 reps BLEs. . Lift 10# kettle bell 15 reps Standing green theraband kicks BLEs abd, ext, add & flex 10 reps ea without UE support.    Neuromuscular re-education: Standing crossways on foam beam with UE resistance blue theraband single UE alternating and BUEs 10 reps ea row, forward reach and biceps curl / upward reach Tandem on foam beam 3 laps and side stepping on foam beam 5 laps including transitioning floor to / from foam.   Therapeutic Activities: Pt amb 100' with 10# weight RUE and 100' LUE safely Pt neg flight 11 steps with light single rail alternating pattern safely.     TREATMENT:                                                DATE: 03/19/2023: Therex Single knee to chest 3 x 20 seconds Supine hamstrings stretch 2 x 20 seconds Gluteal stretch 3 x 20 seconds Figure 4 stretch 3 x 20 seconds -Yoga bridge 10 x 5 seconds with HEP -Side lie clams (lie left with pillows between knees) 10 x 3 seconds with HEP -Side lie hip abduction 10 x 3  seconds (lie left, lift right) with HEP Hip hike at counter top 10 x 3 seconds  Neuromuscular re-education: Tandem balance: Eyes open; head turning; eyes closed 2 x 20 seconds each  Functional Activities: Double Leg Press 100# 10x slow eccentrics Single Leg Press Right Only 75# 10x slow eccentrics Step-down off 4 inch step 10x each side Step-up and over 6 inch step, up with right and down with left slow eccentrics   PATIENT EDUCATION:  Education details: Pt education on PT impairments, prognosis, and POC. Informed consent. Rationale for interventions, safety/comfort with mobility, AD use, following up with provider, close monitoring of symptoms and appropriate action Person educated: Patient Education method: Explanation, Demonstration, Tactile cues, Verbal cues Education comprehension: verbalized understanding, returned demonstration, verbal cues required, tactile cues required, and needs further education    HOME EXERCISE PROGRAM:  Access Code: [email or web address] URL: [email or web address] Date: 03/28/2023 Prepared by: Vladimir Faster  Exercises - Single Knee to Chest Stretch  - 2-3 x daily - 7 x weekly - 1 sets - 4-5 reps - 20 seconds hold - Supine Hamstring Stretch  - 2-3 x daily - 7 x weekly - 1 sets - 4-5 reps - 20 seconds hold - Supine Gluteus Stretch  - 2-3 x daily - 7 x weekly - 1 sets - 4-5 reps - 20 seconds hold - Supine Figure 4 Piriformis Stretch  - 2-3 x daily - 7 x weekly - 1 sets - 4-5 reps - 20 seconds hold - Yoga Bridge  - 2 x daily - 7 x weekly - 1 sets - 10 reps - 5 seconds hold - Clamshell  - 2 x daily - 7 x weekly - 1-2 sets - 10 reps - 3 seconds hold - Sidelying Hip Abduction  - 1 x daily - 7 x weekly - 1 sets - 10 reps - 3 seconds hold - Tandem Stance  - 1 x daily - 7 x weekly - 1 sets - 5  reps - 20 second hold - Standing Hip Hiking  - 3-5 x daily - 7 x weekly - 1 sets - 10 reps - 3 seconds hold   ASSESSMENT:  CLINICAL IMPRESSION: Nishanth had to leave  early for another appointment.  He is requiring much less pain medication as compared to the last time I saw him.  His gait is significantly improved without the cane, although he mentions prolonged weightbearing does increase pain to a level that he does need a tramadol.  With continued proximal hip strengthening, Roddie should meet long-term goals this month and be ready for transfer into independent rehabilitation long-term.    OBJECTIVE IMPAIRMENTS: Abnormal gait, decreased activity tolerance, decreased balance, decreased endurance, decreased mobility, difficulty walking, decreased ROM, decreased strength, impaired perceived functional ability, impaired sensation, improper body mechanics, postural dysfunction, and pain.   ACTIVITY LIMITATIONS: carrying, lifting, standing, squatting, sleeping, stairs, transfers, bed mobility, and locomotion level  PARTICIPATION LIMITATIONS: meal prep, cleaning, laundry, shopping, community activity, and occupation  PERSONAL FACTORS: Age, Time since onset of injury/illness/exacerbation, and 3+ comorbidities: HTN, fracture, hx pericarditis  are also affecting patient's functional outcome.   REHAB POTENTIAL: Good  CLINICAL DECISION MAKING: Evolving/moderate complexity  EVALUATION COMPLEXITY: Moderate   GOALS: Goals reviewed with patient? No - did review role of PT, PT POC  SHORT TERM GOALS: Target date: 02/09/2023 Pt will demonstrate appropriate understanding and performance of initially prescribed HEP in order to facilitate improved independence with management of symptoms.  Baseline: Started 01/25/2023 Goal status: Met 02/09/2023  2. Pt will score greater than or equal to 48 on FOTO in order to demonstrate improved perception of function due to symptoms.  Baseline: 37  Goal status: On Going 03/19/2023  LONG TERM GOALS: Target date: 05/04/2023 Pt will score 58 or greater on FOTO in order to demonstrate improved perception of function due to symptoms.   Baseline: See objective data Goal status:  On Going 03/19/2023  2.  Pt will be able to perform TUG in less than or equal to 13 sec with LRAD in order to indicate reduced risk of falling (cutoff score for fall risk 13.5 sec in community dwelling older adults per [facility] et al, 2000)  Baseline: See objective data  Goal status: Met 03/12/2023  3. Pt will report/demonstrate ability to navigate at least 3 stairs without UE support and grossly WNL mechanics in order to facilitate improved safety/ease with home access.  Baseline: See objective data  Goal status: Met 03/19/2023  4. Pt will report at least 50% decrease in overall pain levels in past week in order to facilitate improved tolerance to basic ADLs/mobility.   Baseline: See subjective data  Goal status: On Going 04/06/2023  5. Pt will demonstrate appropriate performance of final prescribed HEP in order to facilitate improved self-management of symptoms post-discharge.   Baseline: HEP TBD  Goal status: On Going 04/06/2023  6. Pt will be able to ambulate at least 59ft with LRAD and les than 3 pt increase in resting pain on NPS in order to facilitate improved community navigation.  Baseline: See objective data  Goal status: On Going 04/06/2023  PLAN:  PT FREQUENCY: 1-2x/week  PT DURATION: 4 additional weeks for 15 visits total  PLANNED INTERVENTIONS: Therapeutic exercises, Therapeutic activity, Neuromuscular re-education, Balance training, Gait training, Patient/Family education, Self Care, Stair training, Aquatic Therapy, Dry Needling, Cryotherapy, Moist heat, Taping, Manual therapy, and Re-evaluation  PLAN FOR NEXT SESSION: Continue functional strength activities especially Hip abductors strengthening, balance and weight-bearing function progressions  to improve gait quality and endurance without a cane.    Cherlyn Cushing, PT, MPT 04/06/2023, 2:30 PM

## 2023-04-09 ENCOUNTER — Encounter: Payer: No Typology Code available for payment source | Admitting: Physical Therapy

## 2023-04-10 ENCOUNTER — Telehealth: Payer: Self-pay | Admitting: Physical Therapy

## 2023-04-10 ENCOUNTER — Encounter: Payer: No Typology Code available for payment source | Admitting: Physical Therapy

## 2023-04-10 NOTE — Telephone Encounter (Signed)
Patient missed 2:30 PT appointment.  PT called and he was confused by text that said he had an appointment on 10/9.  He requested that 10/9 appt was rescheduled due to [facility] appt conflict.

## 2023-04-11 ENCOUNTER — Encounter: Payer: No Typology Code available for payment source | Admitting: Rehabilitative and Restorative Service Providers"

## 2023-04-18 ENCOUNTER — Telehealth: Payer: Self-pay | Admitting: Neurology

## 2023-04-18 NOTE — Telephone Encounter (Signed)
[id number] 8/1-1/28/25   Patient is scheduled at [facility] for 04/29/23 at 8 pm.  Mailed packet to the patient.

## 2023-04-19 ENCOUNTER — Ambulatory Visit (INDEPENDENT_AMBULATORY_CARE_PROVIDER_SITE_OTHER): Payer: No Typology Code available for payment source | Admitting: Rehabilitative and Restorative Service Providers"

## 2023-04-19 ENCOUNTER — Encounter: Payer: Self-pay | Admitting: Rehabilitative and Restorative Service Providers"

## 2023-04-19 DIAGNOSIS — M25551 Pain in right hip: Secondary | ICD-10-CM | POA: Diagnosis not present

## 2023-04-19 DIAGNOSIS — M6281 Muscle weakness (generalized): Secondary | ICD-10-CM

## 2023-04-19 DIAGNOSIS — R2689 Other abnormalities of gait and mobility: Secondary | ICD-10-CM | POA: Diagnosis not present

## 2023-04-19 NOTE — Therapy (Addendum)
OUTPATIENT PHYSICAL THERAPY TREATMENT  / DISCHARGE   Patient Name: Phillip Gilbert MRN: [id number] DOB:12-16-1955, 67 y.o., male Today's Date: 04/19/2023  END OF SESSION:  PT End of Session - 04/19/23 1104     Visit Number 10    Number of Visits 15    Date for PT Re-Evaluation 05/04/23    [facility]    Authorization - Visit Number 10    Authorization - Number of Visits 15    Progress Note Due on Visit 15    PT Start Time 1103    PT Stop Time 1144    PT Time Calculation (min) 41 min    Activity Tolerance Patient tolerated treatment well;No increased pain    Behavior During Therapy WFL for tasks assessed/performed             Past Medical History:  Diagnosis Date   Hypertension    Pericarditis    History reviewed. No pertinent surgical history. 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   Chronic posttraumatic stress disorder 03/20/2023   Suicidal ideations 03/20/2023   Other recurrent depressive disorders (HCC) 03/20/2023   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   Supraventricular tachycardia, unspecified (HCC) 03/20/2023   Tobacco use 03/20/2023   Prediabetes 03/20/2023   Unspecified osteoarthritis, unspecified site 03/20/2023   Other specified counseling 03/20/2023    [facility]  REFERRING PROVIDER: Amador Cunas, PA-C  REFERRING DIAG: [phone] (ICD-10-CM) - Closed nondisplaced fracture of greater trochanter of right femur,  initial encounter (HCC)  THERAPY DIAG:  Pain in right hip  Muscle weakness (generalized)  Other abnormalities of gait and mobility  Rationale for Evaluation and Treatment: Rehabilitation  ONSET DATE: 12/31/22  SUBJECTIVE:   SUBJECTIVE STATEMENT: Phillip Gilbert notes he needed 0 tramadol this week, which is a significant improvement from a month ago and better than the 2 he needed last week.  No cane use in a few weeks.  PERTINENT HISTORY: HTN, pericarditis, R hip fracture June 2024 Currently wearing zio heart monitor for palpitations  PAIN:  Are you having pain: 0-2/10 with the exception of a "5.5" with sit to stand after prolonged sitting and with the first few steps. Location/description: R hip Best-worst over past week: 0-11/04/08/10  - aggravating factors: Prolonged sitting and sit to stand, first few steps after standing, much better with sleeping on the right side and lower body dressing  - Easing factors: Get off his feet, tramadol (only 2 x this week)    PRECAUTIONS: Rt hip fracture, heart monitor  WEIGHT BEARING RESTRICTIONS: WBAT RLE per referral  FALLS:  Has patient fallen in last 6 months? Yes. Number of falls 1 fall, precipitating episode  LIVING ENVIRONMENT: 1 story house, 3STE B rail Has two roommates, does housework/self care typically but receiving assist as needed  OCCUPATION: retired Electronics engineer - enjoys martial arts, very active, Mining engineer houses, work on car  PLOF: Independent  PATIENT GOALS: get back to being active  NEXT MD VISIT: Dr. Steward Drone on 05/23/2023  OBJECTIVE:   DIAGNOSTIC  FINDINGS:  CT pelvis 12/30/21 "IMPRESSION: Acute mildly comminuted fracture involving the greater trochanter of the right femur. Subtle asymmetric hypodensity and enlargement of right quadratus femoris muscle suspected to be secondary to soft tissue injury."  PATIENT SURVEYS:  04/19/2023 FOTO 57  03/19/2023 FOTO 41  03/12/2023: visit 5 FOTO 42%  Eval: FOTO 37 current, 58  predicted  COGNITION: eval Overall cognitive status: Within functional limits for tasks assessed     SENSATION: eval Light touch intact BIL LE although mildly diminished R lateral thigh/calf and medial knee  PALPATION: eval Tender R lateral hip and posterior aspect of RLE through hamstrings  LOWER EXTREMITY ROM:     Active  Right eval Left/Right 02/01/2023 Right 03/12/2023 Left/Right 03/19/2023 Left/Right 04/19/2023  Hip flexion 85 deg actively with pain, standing using crutches 95/90 supine A: 92* P: 101* painful end feel 90/95 95/95  Hip extension   standing A: 8*    Hip internal rotation  3/0 Seated A: 31* 3/7 4/8  Hip external rotation  27/27 Seated A: 41* 36/35 35/32  Knee extension       Knee flexion       Hamstrings   40/40  60/60 45/45  (Blank rows = not tested) (Key: WFL = within functional limits not formally assessed, * = concordant pain, s = stiffness/stretching sensation, NT = not tested)  Comments: pain returns to baseline levels with cessation of movement  LOWER EXTREMITY MMT:    MMT Right 03/12/23 Right 04/06/2023  Hip flexion 4/5 4+/5  Hip extension 4-/5 4+/5  Hip abduction 4-/5 4/5  Hip internal rotation    Hip external rotation    Knee flexion    Knee extension    Ankle dorsiflexion     (Blank rows = not tested) (Key: WFL = within functional limits not formally assessed, * = concordant pain, s = stiffness/stretching sensation, NT = not tested)  Comments: deferred given acuity of fracture  LOWER EXTREMITY SPECIAL TESTS:  Eval: Deferred given presence of fracture  FUNCTIONAL TESTS:  03/12/2023: SLS LLE >30sec;  RLE 2 sec first attempt & 19 sec 2nd attempt  5X sit to stand: 13.66sec  TUG: 11.62sec  Eval: TUG: 25sec w axillary crutches, step to pattern leading with LLE, minimal WB through RLE   GAIT: 03/12/2023: gait velocity comfortable pace 3.27 ft/sec & fast pace 4.07 ft/sec Pt amb 300' with cane with slight antalgic gait decreased RLE stance  duration.  He neg stairs with single rail step-to pattern safely.   Eval: Distance walked: within clinic Assistive device utilized: Crutches Level of assistance: Modified independence Comments: variable, most often using 4 pt gait pattern with axillary crutches and diminished WB through RLE. Also uses step to pattern with crutches, minimal WB through RLE                   TODAY'S TREATMENT:                                                DATE:  04/10/2023 Therapeutic Exercise: Nu Step legs only for 6 minutes Level 6 Bridging 10 x 10 seconds Hip hike in door frame 2 sets of 10 (1st set 3 second hold & 2nd set 5 second hold) ER/Piriformis stretch & gluteal (knee to opposite shoulder) stretch 4 x 20 seconds Objective hip AROM measures  Neuromuscular re-education: Tandem balance:  Eyes open 1 x; head turning and eyes closed 2 x 20 seconds each   TREATMENT:                                                DATE:  04/06/2023 Recumbent bike Seat 6 for 6 minutes Level 6 Bridging 10 x 8 seconds Side lie hip abduction 10 x 3 seconds  Functional Activities: Single Leg Press Right Only 100# 10x slow eccentrics Step-down off 6 inch step 10x right side Step-up and over 6 inch step, up with right and down with left slow eccentrics 10 x   Neuromuscular re-education: Tandem balance: Eyes open; head turning; on foam; eyes closed 1 x 20 seconds each   04/02/2023: Therapeutic exercise Recumbent bike seat 6 level 3 for 6 min.  Step up, over & step down on BOSU round side up 10 reps 2 step lead so no UE support required Side step on BOSU round side up with non-stance LE reaching across midline ant with stance knee ext & post with stance knee flexed 10 reps BLEs. . Lift 10# kettle bell 15 reps Standing green theraband kicks BLEs abd, ext, add & flex 10 reps ea without UE support.    Neuromuscular re-education: Standing crossways on foam beam with UE resistance blue theraband single UE alternating and  BUEs 10 reps ea row, forward reach and biceps curl / upward reach Tandem on foam beam 3 laps and side stepping on foam beam 5 laps including transitioning floor to / from foam.   Therapeutic Activities: Pt amb 100' with 10# weight RUE and 100' LUE safely Pt neg flight 11 steps with light single rail alternating pattern safely.    PATIENT EDUCATION:  Education details: Pt education on PT impairments, prognosis, and POC. Informed consent. Rationale for interventions, safety/comfort with mobility, AD use, following up with provider, close monitoring of symptoms and appropriate action Person educated: Patient Education method: Explanation, Demonstration, Tactile cues, Verbal cues Education comprehension: verbalized understanding, returned demonstration, verbal cues required, tactile cues required, and needs further education    HOME EXERCISE PROGRAM:  Access Code: [email or web address] URL: [email or web address] Date: 03/28/2023 Prepared by: Vladimir Faster  Exercises - Single Knee to Chest Stretch  - 2-3 x daily - 7 x weekly - 1 sets - 4-5 reps - 20 seconds hold - Supine Hamstring Stretch  - 2-3 x daily - 7 x weekly - 1 sets - 4-5 reps - 20 seconds hold - Supine Gluteus Stretch  - 2-3 x daily - 7 x weekly - 1 sets - 4-5 reps - 20 seconds hold - Supine Figure 4 Piriformis Stretch  - 2-3 x daily - 7 x weekly - 1 sets - 4-5 reps - 20 seconds hold - Yoga Bridge  - 2 x daily - 7 x weekly - 1 sets - 10 reps - 5 seconds hold - Clamshell  - 2 x daily - 7 x weekly - 1-2 sets - 10 reps - 3 seconds hold - Sidelying Hip Abduction  - 1 x daily - 7 x weekly - 1 sets - 10 reps - 3 seconds hold - Tandem Stance  - 1 x daily - 7 x weekly - 1 sets - 5 reps - 20 second hold - Standing Hip Hiking  - 3-5 x daily - 7 x weekly - 1 sets - 10 reps -  3 seconds hold   ASSESSMENT:  CLINICAL IMPRESSION: Phillip Gilbert is making good progress towards long-term goals.  Sit to stand (particularly after prolonged sitting) and  the first few steps after sit to stand are most limiting functionally.  He notes some fatigue later in the day, but other than sit to stand and the first few start-up steps, pain doesn't exceed 2/10.  We weeded his HEP down to facilitate increased compliance with more focus on areas most contributing to his problem.  Phillip Gilbert will follow-up 1-2 x over the next 2 - 4 weeks with discharge and transfer into independent rehabilitation likely at that time.    OBJECTIVE IMPAIRMENTS: Abnormal gait, decreased activity tolerance, decreased balance, decreased endurance, decreased mobility, difficulty walking, decreased ROM, decreased strength, impaired perceived functional ability, impaired sensation, improper body mechanics, postural dysfunction, and pain.   ACTIVITY LIMITATIONS: carrying, lifting, standing, squatting, sleeping, stairs, transfers, bed mobility, and locomotion level  PARTICIPATION LIMITATIONS: meal prep, cleaning, laundry, shopping, community activity, and occupation  PERSONAL FACTORS: Age, Time since onset of injury/illness/exacerbation, and 3+ comorbidities: HTN, fracture, hx pericarditis  are also affecting patient's functional outcome.   REHAB POTENTIAL: Good  CLINICAL DECISION MAKING: Evolving/moderate complexity  EVALUATION COMPLEXITY: Moderate   GOALS: Goals reviewed with patient? No - did review role of PT, PT POC  SHORT TERM GOALS: Target date: 02/09/2023 Pt will demonstrate appropriate understanding and performance of initially prescribed HEP in order to facilitate improved independence with management of symptoms.  Baseline: Started 01/25/2023 Goal status: Met 02/09/2023  2. Pt will score greater than or equal to 48 on FOTO in order to demonstrate improved perception of function due to symptoms.  Baseline: 37  Goal status: Met 04/19/2023  LONG TERM GOALS: Target date: 05/04/2023 Pt will score 58 or greater on FOTO in order to demonstrate improved perception of function due to  symptoms.  Baseline: See objective data Goal status:  On Going 04/19/2023  2.  Pt will be able to perform TUG in less than or equal to 13 sec with LRAD in order to indicate reduced risk of falling (cutoff score for fall risk 13.5 sec in community dwelling older adults per [facility] et al, 2000)  Baseline: See objective data  Goal status: Met 03/12/2023  3. Pt will report/demonstrate ability to navigate at least 3 stairs without UE support and grossly WNL mechanics in order to facilitate improved safety/ease with home access.  Baseline: See objective data  Goal status: Met 03/19/2023  4. Pt will report at least 50% decrease in overall pain levels in past week in order to facilitate improved tolerance to basic ADLs/mobility.   Baseline: See subjective data  Goal status: Partially Met (difficulty with standing from sitting) 04/19/2023  5. Pt will demonstrate appropriate performance of final prescribed HEP in order to facilitate improved self-management of symptoms post-discharge.   Baseline: HEP TBD  Goal status: On Going 04/19/2023  6. Pt will be able to ambulate at least 536ft with LRAD and les than 3 pt increase in resting pain on NPS in order to facilitate improved community navigation.  Baseline: See objective data  Goal status: Met 04/19/2023  PLAN:  PT FREQUENCY: 1 - 2 additional visits  PT DURATION: 4 weeks   PLANNED INTERVENTIONS: Therapeutic exercises, Therapeutic activity, Neuromuscular re-education, Balance training, Gait training, Patient/Family education, Self Care, Stair training, Aquatic Therapy, Dry Needling, Cryotherapy, Moist heat, Taping, Manual therapy, and Re-evaluation  PLAN FOR NEXT SESSION: Check on progress with sit to stand transfers and start-up  stiffness (maybe add sit to stand exercise).  Consider DC if LTGs met.      Cherlyn Cushing, PT, MPT 04/19/2023, 11:51 AM   PHYSICAL THERAPY DISCHARGE SUMMARY  Visits from Start of Care: 10  Current  functional level related to goals / functional outcomes: See note   Remaining deficits: See note   Education / Equipment: HEP  Patient goals were partially met. Patient is being discharged due to not returning since the last visit.  Chyrel Masson, PT, DPT, OCS, ATC 05/23/23  9:45 AM

## 2023-05-23 ENCOUNTER — Ambulatory Visit (HOSPITAL_BASED_OUTPATIENT_CLINIC_OR_DEPARTMENT_OTHER): Payer: No Typology Code available for payment source | Admitting: Orthopaedic Surgery
